# Patient Record
Sex: Female | Born: 1990 | Hispanic: No | Marital: Married | Smoking: Never smoker
Health system: Southern US, Community
[De-identification: ages and names within clinical notes are randomized; demographics above are authoritative.]

## PROBLEM LIST (undated history)

## (undated) DIAGNOSIS — N83209 Unspecified ovarian cyst, unspecified side: Secondary | ICD-10-CM

## (undated) DIAGNOSIS — G43909 Migraine, unspecified, not intractable, without status migrainosus: Secondary | ICD-10-CM

## (undated) DIAGNOSIS — F419 Anxiety disorder, unspecified: Secondary | ICD-10-CM

## (undated) HISTORY — PX: MYRINGOTOMY: SUR874

## (undated) HISTORY — PX: OTHER SURGICAL HISTORY: SHX169

## (undated) HISTORY — DX: Migraine, unspecified, not intractable, without status migrainosus: G43.909

---
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
## 2009-03-21 ENCOUNTER — Ambulatory Visit: Payer: Self-pay | Admitting: Nurse Practitioner

## 2011-05-14 NOTE — Assessment & Plan Note (Signed)
NAMEARGIE, LOBER NO.:  [id number]   MEDICAL RECORD NO.:  [id number]          PATIENT TYPE:  POB   LOCATION:  [facility] at [facility]         FACILITY:  [facility]   PHYSICIAN:  Argentina Donovan, MD        DATE OF BIRTH:  [birth year]   DATE OF SERVICE:  12/13/2008                                  CLINIC NOTE   The patient comes office today with a migraine headache.  She states  that today her headache is an 8 on the scale of 1-10, it is left sided.  She does have worsening of her headache with movement and she does have  photophobia.  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  She does feel that is the trigger for this headache.  The patient has not taken her Norflex.  Prior to this, she did not meet  the full criteria for migraine.  She has not taken any triptans in the  past.   ASSESSMENT:  Migraine headache.   PLAN:  The patient was given a Maxalt 10 mg in the office.  Her headache  decrease from an 8/10 to a 2/10.  She will be given a prescription for  that to take 1 p.o. p.r.n. migraine, okay to repeat once in 2 hours, #12  with the 3 refills.  She is encouraged to take her medicines as  previously prescribed.  We will see her back in 3 months.      [name], NP    ______________________________  Argentina Donovan, MD    LR/MEDQ  D:  12/13/2008  T:  12/14/2008  Job:  [id number]

## 2011-05-14 NOTE — Assessment & Plan Note (Signed)
Megan Newman, Megan Newman               ACCOUNT NO.:  [id number]   MEDICAL RECORD NO.:  [id number]          PATIENT TYPE:  POB   LOCATION:  [facility]         FACILITY:  [facility]   PHYSICIAN:  Allie Bossier, MD        DATE OF BIRTH:  [birth year]   DATE OF SERVICE:  07/14/2008                                  CLINIC NOTE   HISTORY:  The patient comes to the office today for a headache  consultation.  She has been seen and evaluated at length at [facility].  These records have been reviewed.  They have basically come  up with a diagnosis of a chronic persistent daily headache with some  migraine variant.  She has been on several preventatives including  Topamax and amitriptyline.  She is currently on 25 mg of Topamax, and  her headaches are constant, approximately 7 on a scale of 1-10.  The  increase in dosage to greater than 25 has caused her to have some weight  loss.  She is currently 5 feet 7 inches and weighs 147 pounds.  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  She has done some physical therapy in the past and it was  maybe some help; otherwise, she has not done any nonmedical management  of her headache.  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  The patient and mother both deny any anxiety or  depression.  The patient does admit to having some sleep issues  including difficulty falling asleep, difficulty maintaining sleep, and  some chronic daytime fatigue.  The patient also has a history of chronic  constipation which she is cared for.   MENSTRUAL HISTORY:  The patient's menstrual history is that of regular  menstrual cycles.  She is currently using birth control pills for a  normal menstrual cycle.  She does have significant cramps if she does  not use birth control pills.  She and her mother are not sure of which  birth  control pill she is using at this time.   OBSTETRIC HISTORY:  She is G0, P0.   PERSONAL HISTORY:  The patient has a history of asthma and migraine  headaches.   SOCIAL HISTORY:  The patient lives at home with her parents.  She does  work outside of the home at [employer].  She does drink 1-2 caffeinated  beverages per day.  She does not smoke or drink.   REVIEW OF SYSTEMS:  She admits to numbness, weakness, fatigue, weight  gain, frequent headaches, dizzy spells, nausea, vomiting, and hot  flashes.   PHYSICAL EXAMINATION:  GENERAL:  Well-developed, well-nourished 20-year-  old may be a mixed race female, in no acute distress.  VITAL SIGNS:  Blood pressure is 118/66, weight is 147, pulse is 57.  HEENT:  Head is normocephalic and atraumatic.  CARDIAC:  Regular rate and rhythm.  No murmurs, rubs or thrills.  LUNGS:  Clear bilaterally without rales, rhonchi, or wheezes.  NEUROLOGIC:  The patient is neurologically intact.  She is alert and  oriented.  Her speech is clear and fluent.  There is no flight of ideas.  She does not appear overly anxious or histrionic.  She is well  coordinated with good muscle movement and good muscle sensation.   ASSESSMENT:  Chronic persistent daily headaches.   PLAN:  We have decided to work very hard in the next 6 weeks to try to  get her pain at least down to a 3 or 4.  We will do this by trying to  get her sleep in some better control.  We did have a lengthy discussion  on sleep hygiene, and she will be sent for a sleep study.  She will also  be taken to a counselor in [place] area which is [place] by her  mother for counseling.  She will be working on ways to settle her brain  down as well as not be quite such a perfectionist and have some self-  calming techniques.  She will also exercise for 1 hour daily.  We will  also increase the Topamax to 50 mg at bedtime and add Zoloft 50 mg one-  half tablet for 1 week and then up to 1 tablet at bedtime.   The patient  does have Flexeril which she will use at home on a p.r.n. basis, and she  will return to the office in 3 weeks or p.r.n.  We will see her then.      [name], NP    ______________________________  Allie Bossier, MD    LR/MEDQ  D:  07/14/2008  T:  07/15/2008  Job:  [id number]

## 2011-05-14 NOTE — Assessment & Plan Note (Signed)
[name], Newman NO.:  [id number]   MEDICAL RECORD NO.:  [id number]          PATIENT TYPE:  POB   LOCATION:  [facility]         FACILITY:  [facility]   PHYSICIAN:  Ginger Carne, MD DATE OF BIRTH:  Mar 10, 1991   DATE OF SERVICE:  08/04/2008                                  CLINIC NOTE   The patient comes to the office today for a followup on her chronic  daily headaches.  The patient has done approximately 90% of what has  been asked of her.  She has done a very good job of working toward our  goal of decreasing her headache pain level.  She has had her sleep study  done, it has not been read to this point.  She has gone up on her  Topamax to 50 mg.  She has started her Zoloft and is currently at only  one-half tablet.  She did go to the beach for an entire week and during  that time realized that she had only had 1 headache.  She did start her  counseling yesterday.  She does like her counselor and does think that  this will be a worthwhile experience for her.  She has been working on  her sleep hygiene.  She has been trying to go to bed by 1 a.m.  She is  not listening to her music.  She is listening to an ocean CD.  She is  not drinking any caffeine.  She is overall improved.  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  She  realizes that will bring her some stressful events, but she is looking  forward to that.   PHYSICAL EXAMINATION:  GENERAL:  Well-developed, well-nourished 20-year-  old Caucasian female in no acute distress.  VITAL SIGNS:  Blood pressure is 108/71, pulse is 73, and weight is  145.5.   ASSESSMENT AND PLAN:  Chronic daily/migrainous headache.   PLAN:  The patient has again done seemingly a good job of following our  plan.  She does need to go up to 50 mg on her Zoloft, and she will also  go up to 75 mg on her Topamax.  She will continue her counseling and her  good efforts with her sleep  hygiene.  She will be given Norflex 100 mg  to take 1 p.o. b.i.d. p.r.n. headache, #30 with 2 refills.  She is again  encouraged to exercise up to 1 hour per day, that is the one thing that  she has not worked well on.  She will return to the clinic in 6 weeks or  sooner if needed.      Megan Richter, NP    ______________________________  Ginger Carne, MD    LR/MEDQ  D:  08/04/2008  T:  08/05/2008  Job:  [id number]

## 2011-05-14 NOTE — Assessment & Plan Note (Signed)
Megan Newman, Megan Newman               ACCOUNT NO.:  [id number]   MEDICAL RECORD NO.:  [id number]          PATIENT TYPE:  POB   LOCATION:  [facility]         FACILITY:  [facility]   PHYSICIAN:  Johnella Moloney, MD        DATE OF BIRTH:  1991/10/13   DATE OF SERVICE:  03/21/2009                                  CLINIC NOTE   The patient comes to office today for followup on a migraine headache.  The patient states that her headaches are no longer daily that she has  certainly been doing better with her headaches, although she is having  approximately 12 headaches per month.  She is currently in nursing  school.  She is taking 12 hours.  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  She is having some difficulty with the  noise level in the dormitory, but in general is improved.   PHYSICAL EXAMINATION:  VITAL SIGNS:  Blood pressure is 110/68, weight is  137, height is 5 feet 6, pulse is 61.  GENERAL:  Well-developed, well-nourished, 20 year old Caucasian female  in no acute distress.   ASSESSMENT:  Migraine headache.   PLAN:  We have decided that we will go up on her Topamax to 100 mg.  She  will take 1 p.o. at bedtime #30 with 6 refills.  She will get a refill  on her Zoloft 50 mg 1 p.o. daily #30 with 6 refills.  The patient will  also add Anaprox DS 1 p.o. b.i.d. p.r.n. headache #30 with 2 refills.  The patient is asked to use her Maxalt more frequently.  She as yet not  gone through an entire month of prescription, although it has been 3  months since her last office visit.  She will follow up in 6 months.      [name], NP    ______________________________  Johnella Moloney, MD    LR/MEDQ  D:  03/21/2009  T:  03/22/2009  Job:  [id number]

## 2011-05-14 NOTE — Assessment & Plan Note (Signed)
NAMEMAZZIE, Megan Newman NO.:  [id number]   MEDICAL RECORD NO.:  [id number]          PATIENT TYPE:  POB   LOCATION:  [facility] at [facility]         FACILITY:  [facility]   PHYSICIAN:  Tinnie Gens, MD        DATE OF BIRTH:  [birth year]   DATE OF SERVICE:  10/04/2008                                  CLINIC NOTE   The patient comes to the office today to follow up on her chronic  tension-type headaches.  The patient is currently at 75 mg of her  Topamax and 50 mg of her Zoloft and she would like to stay at that  level.  She is currently doing her exercises several times per week.  She does feel that her headaches are overall significantly improved.  Her headaches are somewhat worse around her menstrual cycle.  She does  not describe migraine-like symptoms.  She describes more of tension-type  headaches.  She was given a prescription for Norflex at her last office  visit and has not taken that.  She has left that at her mother's house.  She is advised to get that from her mother's house and start those when  she has a headache.  She did want me to know that she had one episode of  near fainting episode at the beach this summer.  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   VITAL SIGNS:  Blood pressure is 97/67, pulse is 54, weight is 141.   ASSESSMENT:  Chronic headaches, improving.   PLAN:  The patient will continue Topamax 75 mg and Zoloft 50 mg.  She  will take these daily.  She has opted not to go up on these at this  time.  She is advised to get her Norflex and keep that at school with  her.  She will return to the clinic in 3 months.  She can call if she  needs to come in sooner.      Megan Richter, NP    ______________________________  Tinnie Gens, MD    LR/MEDQ  D:  10/04/2008  T:  10/05/2008  Job:  [id number]

## 2011-05-17 NOTE — Procedures (Signed)
NAMEVALOR, Megan Newman               ACCOUNT NO.:  [id number]   MEDICAL RECORD NO.:  [id number]          PATIENT TYPE:  OUT   LOCATION:  S[facility]EP CENTER                 FACILITY:  [facility]   PHYSICIAN:  Clinton D. Maple Hudson, MD, FCCP, FACPDATE OF BIRTH:  1991/06/27   DATE OF STUDY:                            NOCTURNAL POLYSOMNOGRAM   REFERRING PHYSICIAN:  [name], NP   INDICATION FOR STUDY:  Hypersomnia with sleep apnea.  Restless sleep.   EPWORTH SLEEPINESS SCORE:  8/24.  BMI 23.  Weight 143 pounds, height 66  inches, neck 11 inches.   MEDICATIONS:  Home medications charted and reviewed.   SLEEP ARCHITECTURE:  Total sleep time 340.5 minutes with sleep  efficiency 90.4%.  Stage I was 3.5%.  Stage II 85.9%.  Stage III 3.7%.  REM 6.9% of total sleep time.  Sleep latency 25 minutes.  REM latency  113 minutes.  Awake after sleep onset 10.5 minutes.  Arousal index 9.2.  No bedtime medication taken.   RESPIRATORY DATA:  Apnea/hypopnea index (AHI) 0.2 per hour.  A single  respiratory event was scored as a central apnea.   OXYGEN DATA:  Minimal snoring with oxygen desaturation to a nadir of  97%.  Mean oxygen saturation through the study was 98.3% on room air.   CARDIAC DATA:  Normal sinus rhythm with occasional PAC.   MOVEMENT-PARASOMNIA:  No significant movement disturbance.  No bathroom  trips.   IMPRESSIONS-RECOMMENDATIONS:  1. No significant respiratory disturbance of sleep, AHI 0.2 per hour      (normal range 0-5 per hour).  Minimal snoring with oxygen      desaturation to a nadir of 97%.  2. No significant movement disturbance.  3. Unremarkable sleep architecture for age and sleep center      environment.      Clinton D. Maple Hudson, MD, [facility], FACP  Diplomate, Biomedical engineer of Sleep Medicine  Electronically Signed    CDY/MEDQ  D:  08/06/2008 11:12:27  T:  08/06/2008 11:46:57  Job:  [id number]

## 2012-05-22 ENCOUNTER — Ambulatory Visit: Payer: Self-pay

## 2013-11-10 DIAGNOSIS — Z8742 Personal history of other diseases of the female genital tract: Secondary | ICD-10-CM | POA: Insufficient documentation

## 2015-10-08 ENCOUNTER — Emergency Department (HOSPITAL_COMMUNITY): Payer: 59

## 2015-10-08 ENCOUNTER — Encounter (HOSPITAL_COMMUNITY): Payer: Self-pay | Admitting: Emergency Medicine

## 2015-10-08 ENCOUNTER — Emergency Department (HOSPITAL_COMMUNITY)
Admission: EM | Admit: 2015-10-08 | Discharge: 2015-10-08 | Disposition: A | Discharge status: Home / Self Care | Payer: 59 | Attending: Emergency Medicine | Admitting: Emergency Medicine

## 2015-10-08 DIAGNOSIS — R102 Pelvic and perineal pain: Secondary | ICD-10-CM

## 2015-10-08 DIAGNOSIS — R1030 Lower abdominal pain, unspecified: Secondary | ICD-10-CM | POA: Insufficient documentation

## 2015-10-08 DIAGNOSIS — N9412 Deep dyspareunia: Secondary | ICD-10-CM | POA: Insufficient documentation

## 2015-10-08 DIAGNOSIS — F419 Anxiety disorder, unspecified: Secondary | ICD-10-CM | POA: Diagnosis not present

## 2015-10-08 DIAGNOSIS — R61 Generalized hyperhidrosis: Secondary | ICD-10-CM | POA: Insufficient documentation

## 2015-10-08 DIAGNOSIS — Z79899 Other long term (current) drug therapy: Secondary | ICD-10-CM | POA: Insufficient documentation

## 2015-10-08 DIAGNOSIS — Z8742 Personal history of other diseases of the female genital tract: Secondary | ICD-10-CM | POA: Diagnosis not present

## 2015-10-08 DIAGNOSIS — Z3202 Encounter for pregnancy test, result negative: Secondary | ICD-10-CM | POA: Diagnosis not present

## 2015-10-08 HISTORY — DX: Anxiety disorder, unspecified: F41.9

## 2015-10-08 HISTORY — DX: Unspecified ovarian cyst, unspecified side: N83.209

## 2015-10-08 LAB — I-STAT BETA HCG BLOOD, ED (MC, WL, AP ONLY): I-stat hCG, quantitative: <5 m[IU]/mL (ref <5)

## 2015-10-08 LAB — CBC WITH DIFFERENTIAL/PLATELET
BASOS PCT: 0 %
Basophils Absolute: 0 10*3/uL (ref 0.0–0.1)
EOS ABS: 0.4 10*3/uL (ref 0.0–0.7)
Eosinophils Relative: 6 %
HCT: 39.8 % (ref 36.0–46.0)
HEMOGLOBIN: 13.1 g/dL (ref 12.0–15.0)
Lymphocytes Relative: 27 %
Lymphs Abs: 1.8 10*3/uL (ref 0.7–4.0)
MCH: 29.2 pg (ref 26.0–34.0)
MCHC: 32.9 g/dL (ref 30.0–36.0)
MCV: 88.8 fL (ref 78.0–100.0)
MONOS PCT: 5 %
Monocytes Absolute: 0.3 10*3/uL (ref 0.1–1.0)
NEUTROS PCT: 62 %
Neutro Abs: 4.3 10*3/uL (ref 1.7–7.7)
PLATELETS: 268 10*3/uL (ref 150–400)
RBC: 4.48 MIL/uL (ref 3.87–5.11)
RDW: 13.1 % (ref 11.5–15.5)
WBC: 6.9 10*3/uL (ref 4.0–10.5)

## 2015-10-08 LAB — URINALYSIS, ROUTINE W REFLEX MICROSCOPIC
BILIRUBIN URINE: NEGATIVE
Glucose, UA: NEGATIVE mg/dL
HGB URINE DIPSTICK: NEGATIVE
KETONES UR: NEGATIVE mg/dL
Leukocytes, UA: NEGATIVE
Nitrite: NEGATIVE
PROTEIN: NEGATIVE mg/dL
Specific Gravity, Urine: 1.022 (ref 1.005–1.030)
UROBILINOGEN UA: 0.2 mg/dL (ref 0.0–1.0)
pH: 5.5 (ref 5.0–8.0)

## 2015-10-08 LAB — BASIC METABOLIC PANEL
Anion gap: 8 (ref 5–15)
BUN: 8 mg/dL (ref 6–20)
CALCIUM: 9 mg/dL (ref 8.9–10.3)
CO2: 21 mmol/L — AB (ref 22–32)
CREATININE: 0.69 mg/dL (ref 0.44–1.00)
Chloride: 107 mmol/L (ref 101–111)
GFR calc non Af Amer: >60 mL/min (ref >60)
Glucose, Bld: 100 mg/dL — ABNORMAL HIGH (ref 65–99)
Potassium: 3.9 mmol/L (ref 3.5–5.1)
SODIUM: 136 mmol/L (ref 135–145)

## 2015-10-08 LAB — WET PREP, GENITAL
Clue Cells Wet Prep HPF POC: NONE SEEN
Trich, Wet Prep: NONE SEEN
YEAST WET PREP: NONE SEEN

## 2015-10-08 MED ORDER — SODIUM CHLORIDE 0.9 % IV BOLUS (SEPSIS): 1000.0000 mL | Freq: Once | INTRAVENOUS | Status: DC

## 2015-10-08 MED ORDER — ONDANSETRON HCL 4 MG/2ML IJ SOLN
4.0000 mg | Freq: Once | INTRAMUSCULAR | Status: DC
  Filled 2015-10-08: qty 2

## 2015-10-08 MED ORDER — ONDANSETRON 4 MG PO TBDP: 4.0000 mg | ORAL_TABLET | ORAL | Status: DC | PRN

## 2015-10-08 NOTE — ED Notes (Signed)
Patient transported to Ultrasound 

## 2015-10-08 NOTE — Discharge Instructions (Signed)
Pelvic Pain, Female °Female pelvic pain can be caused by many different things and start from a variety of places. Pelvic pain refers to pain that is located in the lower half of the abdomen and between your hips. The pain may occur over a short period of time (acute) or may be reoccurring (chronic). The cause of pelvic pain may be related to disorders affecting the female reproductive organs (gynecologic), but it may also be related to the bladder, kidney stones, an intestinal complication, or muscle or skeletal problems. Getting help right away for pelvic pain is important, especially if there has been severe, sharp, or a sudden onset of unusual pain. It is also important to get help right away because some types of pelvic pain can be life threatening.  °CAUSES  °Below are only some of the causes of pelvic pain. The causes of pelvic pain can be in one of several categories.  °· Gynecologic. °¨ Pelvic inflammatory disease. °¨ Sexually transmitted infection. °¨ Ovarian cyst or a twisted ovarian ligament (ovarian torsion). °¨ Uterine lining that grows outside the uterus (endometriosis). °¨ Fibroids, cysts, or tumors. °¨ Ovulation. °· Pregnancy. °¨ Pregnancy that occurs outside the uterus (ectopic pregnancy). °¨ Miscarriage. °¨ Labor. °¨ Abruption of the placenta or ruptured uterus. °· Infection. °¨ Uterine infection (endometritis). °¨ Bladder infection. °¨ Diverticulitis. °¨ Miscarriage related to a uterine infection (septic abortion). °· Bladder. °¨ Inflammation of the bladder (cystitis). °¨ Kidney stone(s). °· Gastrointestinal. °¨ Constipation. °¨ Diverticulitis. °· Neurologic. °¨ Trauma. °¨ Feeling pelvic pain because of mental or emotional causes (psychosomatic). °· Cancers of the bowel or pelvis. °EVALUATION  °Your caregiver will want to take a careful history of your concerns. This includes recent changes in your health, a careful gynecologic history of your periods (menses), and a sexual history. Obtaining  your family history and medical history is also important. Your caregiver may suggest a pelvic exam. A pelvic exam will help identify the location and severity of the pain. It also helps in the evaluation of which organ system may be involved. In order to identify the cause of the pelvic pain and be properly treated, your caregiver may order tests. These tests may include:  °· A pregnancy test. °· Pelvic ultrasonography. °· An X-ray exam of the abdomen. °· A urinalysis or evaluation of vaginal discharge. °· Blood tests. °HOME CARE INSTRUCTIONS  °· Only take over-the-counter or prescription medicines for pain, discomfort, or fever as directed by your caregiver.   °· Rest as directed by your caregiver.   °· Eat a balanced diet.   °· Drink enough fluids to make your urine clear or pale yellow, or as directed.   °· Avoid sexual intercourse if it causes pain.   °· Apply warm or cold compresses to the lower abdomen depending on which one helps the pain.   °· Avoid stressful situations.   °· Keep a journal of your pelvic pain. Write down when it started, where the pain is located, and if there are things that seem to be associated with the pain, such as food or your menstrual cycle. °· Follow up with your caregiver as directed.   °SEEK MEDICAL CARE IF: °· Your medicine does not help your pain. °· You have abnormal vaginal discharge. °SEEK IMMEDIATE MEDICAL CARE IF:  °· You have heavy bleeding from the vagina.   °· Your pelvic pain increases.   °· You feel light-headed or faint.   °· You have chills.   °· You have pain with urination or blood in your urine.   °· You have uncontrolled   diarrhea or vomiting.   You have a fever or persistent symptoms for more than 3 days. Abdominal Pain, Adult Many things can cause abdominal pain. Usually, abdominal pain is not caused by a disease and will improve without treatment. It can often be observed and treated at home. Your health care provider will do a physical exam and possibly  order blood tests and X-rays to help determine the seriousness of your pain. However, in many cases, more time must pass before a clear cause of the pain can be found. Before that point, your health care provider may not know if you need more testing or further treatment. HOME CARE INSTRUCTIONS Monitor your abdominal pain for any changes. The following actions may help to alleviate any discomfort you are experiencing: Only take over-the-counter or prescription medicines as directed by your health care provider. Do not take laxatives unless directed to do so by your health care provider. Try a clear liquid diet (broth, tea, or water) as directed by your health care provider. Slowly move to a bland diet as tolerated. SEEK MEDICAL CARE IF: You have unexplained abdominal pain. You have abdominal pain associated with nausea or diarrhea. You have pain when you urinate or have a bowel movement. You experience abdominal pain that wakes you in the night. You have abdominal pain that is worsened or improved by eating food. You have abdominal pain that is worsened with eating fatty foods. You have a fever. SEEK IMMEDIATE MEDICAL CARE IF: Your pain does not go away within 2 hours. You keep throwing up (vomiting). Your pain is felt only in portions of the abdomen, such as the right side or the left lower portion of the abdomen. You pass bloody or black tarry stools. MAKE SURE YOU: Understand these instructions. Will watch your condition. Will get help right away if you are not doing well or get worse.   This information is not intended to replace advice given to you by your health care provider. Make sure you discuss any questions you have with your health care provider.   Document Released: 09/25/2005 Document Revised: 09/06/2015 Document Reviewed: 08/25/2013 Elsevier Interactive Patient Education 2016 [employer].   You have a fever and your symptoms suddenly get worse.   You are being  physically or sexually abused.   This information is not intended to replace advice given to you by your health care provider. Make sure you discuss any questions you have with your health care provider.   Document Released: 11/12/2004 Document Revised: 09/06/2015 Document Reviewed: 04/06/2012 Elsevier Interactive Patient Education [employer]

## 2015-10-08 NOTE — ED Provider Notes (Signed)
Complains of infraumbilical pain, nonradiating onset upon awakening 7 AM today. Pain feels like ruptured ovarian cyst that she experienced in the past She is presently pain-free without treatment. Presently hungry. No other associated symptoms. On exam alert no distress. On exam alert no distress. Abdomen nondistended normal active bowel sounds nontender  Doug Sou, MD 10/08/15 1054

## 2015-10-08 NOTE — ED Provider Notes (Signed)
CSN: [id number]     Arrival date & time 10/08/15  0841 History   First MD Initiated Contact with Patient 10/08/15 [phone]     Chief Complaint  Patient presents with  . Abdominal Pain     (Consider location/radiation/quality/duration/timing/severity/associated sxs/prior Treatment) HPI  Patient is a 24 year old female with history of ovarian cyst, anxiety, HPV, and endometriosis, she presents to the emergency Department with generalized lower abdominal pain that began suddenly at 7 AM this morning, described as sharp, rated 8/10 in severity, without radiation. It lasted for approximately 20 minutes with a gradual increase in severity to 10/10 pain causing her to" on all fours "on the bathroom floor, with nausea, hot flashes and sweats.  She was unable to ease the pain with any positional changes, she did not take any medicine to treat him however it spontaneously began to resolve after approximately 20 minutes, but the location of her pain did localize to her right lower quadrant.  She presented to the emergency room for evaluation of abdominal pain, and upon arrival had some remaining pressure, but denies any pain.  In 2014 patient states she had an ovarian cyst rupture with similar symptoms however she states at that time they were more severe causing her to have syncopal episode and vomiting.  The patient denies any hematuria, dysuria, urinary frequency, urinary urgency, vaginal discharge, vaginal irritation, vomiting, diarrhea, constipation, fever, chills, sweats, loss of appetite, flank pain.  She does not believe that she has had any exposure to STDs, she has been with her boyfriend for 4 years and is monogamous, without any barrier methods of birth control, however she is on continuous oral birth control. 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 She has occasional spotting and estimates her last vaginal bleeding/spotting was early September.  She does not believe she could be pregnant  Past Medical History  Diagnosis Date  . Ovarian cyst   . Anxiety    Past Surgical History  Procedure Laterality Date  . Arthroscopic knee surgery (right knee)      right knee   History reviewed. No pertinent family history. Social History  Substance Use Topics  . Smoking status: Never Smoker   . Smokeless tobacco: None  . Alcohol Use: Yes     Comment: occassional   OB History    No data available     Review of Systems  Constitutional: Positive for diaphoresis. Negative for fever, chills, activity change, appetite change, fatigue and unexpected weight change.  HENT: Negative.   Eyes: Negative.   Respiratory: Negative.  Negative for chest tightness and shortness of breath.   Cardiovascular: Negative.  Negative for chest pain, palpitations and leg swelling.  Gastrointestinal: Negative for vomiting, diarrhea, constipation, blood in stool, abdominal distention and anal bleeding.  Endocrine: Negative.   Genitourinary: Positive for dyspareunia. Negative for dysuria, urgency, frequency, hematuria, flank pain, decreased urine volume, vaginal bleeding, vaginal discharge, difficulty urinating, genital sores and vaginal pain.  Musculoskeletal: Negative.   Skin: Negative.   Neurological: Negative.   Psychiatric/Behavioral: Negative.       Allergies  Cephalosporins  Home Medications   Prior to Admission medications   Medication Sig Start Date End Date Taking? Authorizing Provider  acetaminophen (TYLENOL) 325 MG tablet Take 650 mg by mouth every 6 (six) hours as  needed (pain).   Yes Historical Provider, MD  desogestrel-ethinyl estradiol (APRI,EMOQUETTE,SOLIA) 0.15-30 MG-MCG tablet Take 1 tablet by mouth daily. 06/08/15  Yes Historical Provider, MD  rizatriptan (MAXALT) 10  MG tablet Take 10 mg by mouth as needed for migraine. May repeat in 2 hours if needed   Yes Historical Provider, MD  sertraline (ZOLOFT) 50 MG tablet Take 50 mg by mouth daily.   Yes Historical Provider, MD   BP 109/66 mmHg  Pulse 66  Temp(Src) 98.1 F (36.7 C) (Oral)  Resp 18  Ht 5\' 6"  (1.676 m)  Wt 179 lb (81.194 kg)  BMI 28.91 kg/m2  SpO2 99% Physical Exam  Constitutional: She is oriented to person, place, and time. She appears well-developed and well-nourished. No distress.  HENT:  Head: Normocephalic and atraumatic.  Nose: Nose normal.  Mouth/Throat: Oropharynx is clear and moist. No oropharyngeal exudate.  Eyes: Conjunctivae and EOM are normal. Pupils are equal, round, and reactive to light. Right eye exhibits no discharge. Left eye exhibits no discharge. No scleral icterus.  Neck: Normal range of motion. No JVD present. No tracheal deviation present. No thyromegaly present.  Cardiovascular: Normal rate, regular rhythm, normal heart sounds and intact distal pulses.  Exam reveals no gallop and no friction rub.   No murmur heard. Pulmonary/Chest: Effort normal and breath sounds normal. No respiratory distress. She has no wheezes. She has no rales. She exhibits no tenderness.  Abdominal: Soft. Normal appearance and bowel sounds are normal. She exhibits no distension and no mass. There is tenderness in the right lower quadrant. There is rebound and tenderness at McBurney's point. There is no guarding and no CVA tenderness.  No suprapubic tenderness, negative Rovsing's, point tenderness to palpation at McBurney's point, negative psoas and obturator sign, negative heel tap test, no CVA tenderness  Genitourinary: Uterus normal. Cervix exhibits motion tenderness. Right adnexum displays no mass, no tenderness and no fullness. Left adnexum displays no mass, no tenderness and no fullness.  Musculoskeletal: Normal range of motion. She exhibits no edema or tenderness.  Lymphadenopathy:    She  has no cervical adenopathy.  Neurological: She is alert and oriented to person, place, and time. She has normal reflexes. No cranial nerve deficit. She exhibits normal muscle tone. Coordination normal.  Skin: Skin is warm and dry. No rash noted. She is not diaphoretic. No erythema. No pallor.  Psychiatric: She has a normal mood and affect. Her behavior is normal. Judgment and thought content normal.  Nursing note and vitals reviewed.   ED Course  Procedures (including critical care time) Labs Review Labs Reviewed  WET PREP, GENITAL - Abnormal; Notable for the following:    WBC, Wet Prep HPF POC FEW (*)    All other components within normal limits  BASIC METABOLIC PANEL - Abnormal; Notable for the following:    CO2 21 (*)    Glucose, Bld 100 (*)    All other components within normal limits  CBC WITH DIFFERENTIAL/PLATELET  URINALYSIS, ROUTINE W REFLEX MICROSCOPIC (NOT AT [facility])  RPR  HIV ANTIBODY (ROUTINE TESTING)  I-STAT BETA HCG BLOOD, ED ([facility], [facility], [facility] ONLY)  GC/CHLAMYDIA PROBE AMP ([facility]) NOT AT [facility]   Pelvic exam: VULVA: normal appearing vulva with no masses, tenderness or lesions, VAGINA: normal appearing vagina with normal color and discharge, no lesions, CERVIX: normal appearing cervix without lesions, erythema, not friable, nulliparous os, moderate mucoid discharge clear to white, UTERUS: uterus is normal size, shape, consistency and nontender, ADNEXA: normal adnexa in size,  nontender and no masses, WET MOUNT done - results: negative for pathogens, normal epithelial cells, + white blood cells, exam chaperoned by [facility] RN.   Imaging Review US Transvaginal Non-ob  10/08/2015   CLINICAL DATA:  Acute right lower quadrant pelvic pain.  EXAM: TRANSABDOMINAL AND TRANSVAGINAL ULTRASOUND OF PELVIS  DOPPLER ULTRASOUND OF OVARIES  TECHNIQUE: Both transabdominal and transvaginal ultrasound examinations of the pelvis were performed. Transabdominal technique was performed for global  imaging of the pelvis including uterus, ovaries, adnexal regions, and pelvic cul-de-sac.  It was necessary to proceed with endovaginal exam following the transabdominal exam to visualize the endometrium and ovaries. Color and duplex Doppler ultrasound was utilized to evaluate blood flow to the ovaries.  COMPARISON:  Ultrasound of May 22, 2012.  FINDINGS: Uterus  Measurements: 6.0 x 4.1 x 3.1 cm. No fibroids or other mass visualized.  Endometrium  Thickness: 3.5 mm which is within normal limits. No focal abnormality visualized.  Right ovary  Measurements: 2.2 x 2.1 x 1.1 cm. Normal appearance/no adnexal mass.  Left ovary  Measurements: 1.9 x 1.5 x 1.4 cm. Normal appearance/no adnexal mass.  Pulsed Doppler evaluation of both ovaries demonstrates normal low-resistance arterial and venous waveforms.  Other findings  Trace free fluid is noted which most likely is physiologic.  IMPRESSION: No definite abnormality seen in the pelvis.   Electronically Signed   By: Lupita Raider, M.D.   On: 10/08/2015 11:41   US Pelvis Complete  10/08/2015   CLINICAL DATA:  Acute right lower quadrant pelvic pain.  EXAM: TRANSABDOMINAL AND TRANSVAGINAL ULTRASOUND OF PELVIS  DOPPLER ULTRASOUND OF OVARIES  TECHNIQUE: Both transabdominal and transvaginal ultrasound examinations of the pelvis were performed. Transabdominal technique was performed for global imaging of the pelvis including uterus, ovaries, adnexal regions, and pelvic cul-de-sac.  It was necessary to proceed with endovaginal exam following the transabdominal exam to visualize the endometrium and ovaries. Color and duplex Doppler ultrasound was utilized to evaluate blood flow to the ovaries.  COMPARISON:  Ultrasound of May 22, 2012.  FINDINGS: Uterus  Measurements: 6.0 x 4.1 x 3.1 cm. No fibroids or other mass visualized.  Endometrium  Thickness: 3.5 mm which is within normal limits. No focal abnormality visualized.  Right ovary  Measurements: 2.2 x 2.1 x 1.1 cm. Normal  appearance/no adnexal mass.  Left ovary  Measurements: 1.9 x 1.5 x 1.4 cm. Normal appearance/no adnexal mass.  Pulsed Doppler evaluation of both ovaries demonstrates normal low-resistance arterial and venous waveforms.  Other findings  Trace free fluid is noted which most likely is physiologic.  IMPRESSION: No definite abnormality seen in the pelvis.   Electronically Signed   By: Lupita Raider, M.D.   On: 10/08/2015 11:41   [place] Art/ven Flow Abd Pelv Doppler  10/08/2015   CLINICAL DATA:  Acute right lower quadrant pelvic pain.  EXAM: TRANSABDOMINAL AND TRANSVAGINAL ULTRASOUND OF PELVIS  DOPPLER ULTRASOUND OF OVARIES  TECHNIQUE: Both transabdominal and transvaginal ultrasound examinations of the pelvis were performed. Transabdominal technique was performed for global imaging of the pelvis including uterus, ovaries, adnexal regions, and pelvic cul-de-sac.  It was necessary to proceed with endovaginal exam following the transabdominal exam to visualize the endometrium and ovaries. Color and duplex Doppler ultrasound was utilized to evaluate blood flow to the ovaries.  COMPARISON:  Ultrasound of May 22, 2012.  FINDINGS: Uterus  Measurements: 6.0 x 4.1 x 3.1 cm. No fibroids or other mass visualized.  Endometrium  Thickness: 3.5 mm which is within normal  limits. No focal abnormality visualized.  Right ovary  Measurements: 2.2 x 2.1 x 1.1 cm. Normal appearance/no adnexal mass.  Left ovary  Measurements: 1.9 x 1.5 x 1.4 cm. Normal appearance/no adnexal mass.  Pulsed Doppler evaluation of both ovaries demonstrates normal low-resistance arterial and venous waveforms.  Other findings  Trace free fluid is noted which most likely is physiologic.  IMPRESSION: No definite abnormality seen in the pelvis.   Electronically Signed   By: Lupita Raider, M.D.   On: 10/08/2015 11:41   I have personally reviewed and evaluated these images and lab results as part of my medical decision-making.   EKG Interpretation None       MDM   Final diagnoses:  Lower abdominal pain    Pt with lower abdominal pain/pelvic pain, with sudden onset this am, minimal RLQ suprapubic  "pressure" remaining upon presentation to the ER.    Ddx:  Ovarian cyst may be etiology, however more urgent diagnoses to rule out include Ovarian torsion, acute appendicitis, TOA, PID  Her initial vitals are within normal limits, she is afebrile, without tachycardia or hypertension, which makes acute abdomen or advanced infection less likely.  Bimanual exam at bedside reveals mild CMT.  The plan, pending negative pregnancy test, includes pelvic ultrasound with flow, pelvic exam with STD testing.  Patient states she is unable to give a urine sample this time.  Basic labs, STD panel, i-stat beta hCG ordered at this time. Staff was unable to place an IV due to difficult IV start, currently unable to get IV fluids. Patient will remain NPO at this time. She denied current pain or nausea, was not given any pain medicine or Zofran.   [place] negative, trace free fluid Patient's lab work is unremarkable, pelvic exam and STD testing obtained, wet prep shows white blood cells but no presence of trichomoniasis, clue cells, or yeast.  She had no CMT when having transvaginal ultrasound.    Pt has not had any more abdominal pain, I do not believe that CT imaging is currently indicated without symptoms.  This was discussed with the patient and her family.  They agree that they are comfortable going home.  They only live 5 minutes from the hospital.  They agree to return if pt has increasing/severe abd pain, N/V fever.    Pt was not treated for STD's, no concern for PID.  Pt will be contacted for tx if there are any positive results.    Danelle Berry, PA-C 10/09/15 1052  Doug Sou, MD 10/09/15 1710

## 2015-10-08 NOTE — ED Notes (Signed)
Pt woke up at 7am and had abd pain. Pt states she got hot and started sweating, felt like she was going to vomit, pt could barely stand. Pt states she experienced this several years ago and it was a ruptured ovarian cyst. Pt states now it just feels tight and slight twinges of pain.

## 2015-10-09 LAB — HIV ANTIBODY (ROUTINE TESTING W REFLEX): HIV Screen 4th Generation wRfx: NONREACTIVE

## 2015-10-09 LAB — GC/CHLAMYDIA PROBE AMP (~~LOC~~) NOT AT ARMC
Chlamydia: NEGATIVE
Neisseria Gonorrhea: NEGATIVE

## 2015-10-09 LAB — RPR: RPR: NONREACTIVE

## 2017-06-19 ENCOUNTER — Other Ambulatory Visit: Payer: Self-pay | Admitting: Physician Assistant

## 2017-06-19 DIAGNOSIS — R110 Nausea: Secondary | ICD-10-CM

## 2017-06-19 DIAGNOSIS — M5489 Other dorsalgia: Secondary | ICD-10-CM

## 2017-06-20 ENCOUNTER — Ambulatory Visit
Admission: RE | Admit: 2017-06-20 | Discharge: 2017-06-20 | Disposition: A | Discharge status: Home / Self Care | Payer: BLUE CROSS/BLUE SHIELD | Source: Ambulatory Visit | Attending: Physician Assistant | Admitting: Physician Assistant

## 2017-06-20 DIAGNOSIS — R110 Nausea: Secondary | ICD-10-CM

## 2017-06-20 DIAGNOSIS — M5489 Other dorsalgia: Secondary | ICD-10-CM

## 2017-06-20 MED ORDER — IOPAMIDOL (ISOVUE-300) INJECTION 61%
100.0000 mL | Freq: Once | INTRAVENOUS | Status: AC | PRN
  Administered 2017-06-20: 100 mL via INTRAVENOUS

## 2017-11-11 ENCOUNTER — Other Ambulatory Visit: Payer: Self-pay | Admitting: Physician Assistant

## 2017-11-11 DIAGNOSIS — R1013 Epigastric pain: Secondary | ICD-10-CM

## 2017-11-13 ENCOUNTER — Ambulatory Visit
Admission: RE | Admit: 2017-11-13 | Discharge: 2017-11-13 | Disposition: A | Discharge status: Home / Self Care | Payer: BLUE CROSS/BLUE SHIELD | Source: Ambulatory Visit | Attending: Physician Assistant | Admitting: Physician Assistant

## 2017-11-13 DIAGNOSIS — R1013 Epigastric pain: Secondary | ICD-10-CM

## 2017-11-17 ENCOUNTER — Other Ambulatory Visit: Payer: BLUE CROSS/BLUE SHIELD

## 2017-11-22 IMAGING — CT CT ABD-PELV W/ CM
3 of 4 series · 13 of 36 positions shown, 19 images · IV contrast (READICAT/WATER & [ID] ISOVUE 300)
Comparison: None.

CLINICAL DATA: Low back pain for 1 month.  Nausea.

EXAM:
CT ABDOMEN AND PELVIS WITH CONTRAST
TECHNIQUE: Multidetector CT imaging of the abdomen and pelvis was performed
using the standard protocol following bolus administration of
intravenous contrast.
CONTRAST:  100mL 4DUILS-QCC IOPAMIDOL (4DUILS-QCC) INJECTION 61%

[Series 3: abd/pelvis with · axial · 0.84mm/px · z∈[-376,-76]mm · 7 of 82 slices shown, 12 images]
[im 11/82  soft-tissue]
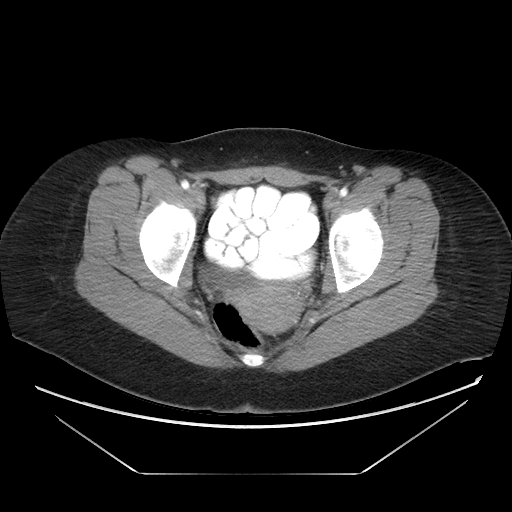
[im 11/82  bone]
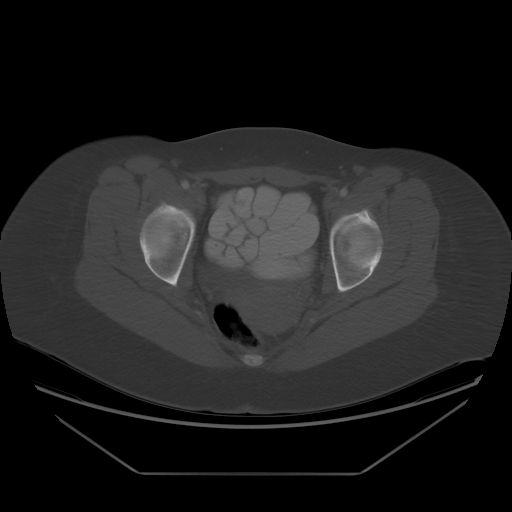
[im 21/82  soft-tissue]
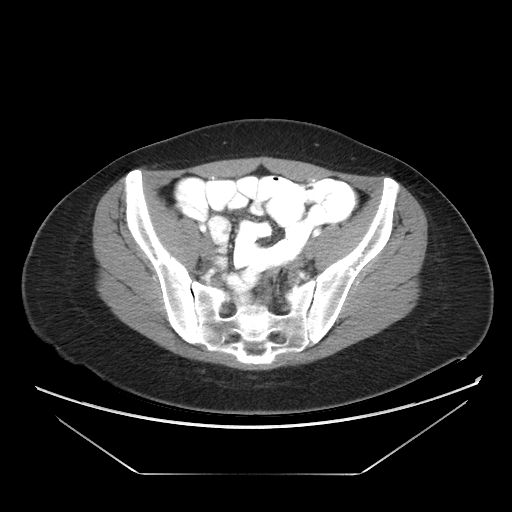
[im 31/82  soft-tissue]
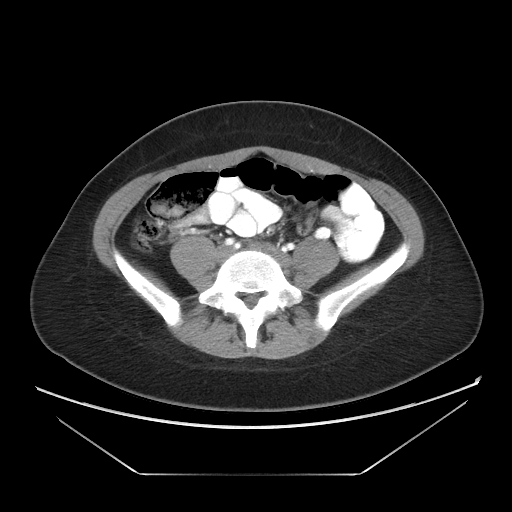
[im 41/82  soft-tissue]
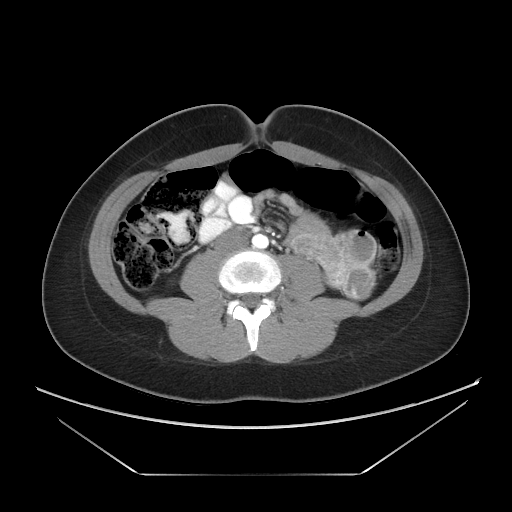
[im 41/82  lung]
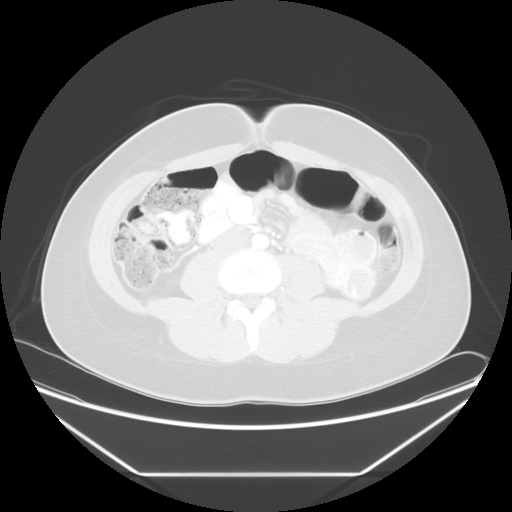
[im 51/82  soft-tissue]
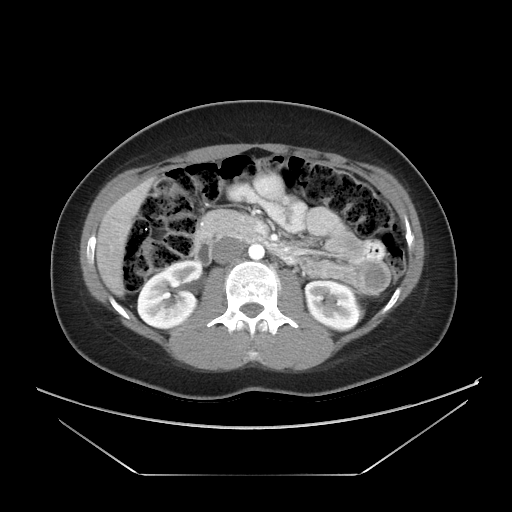
[im 51/82  lung]
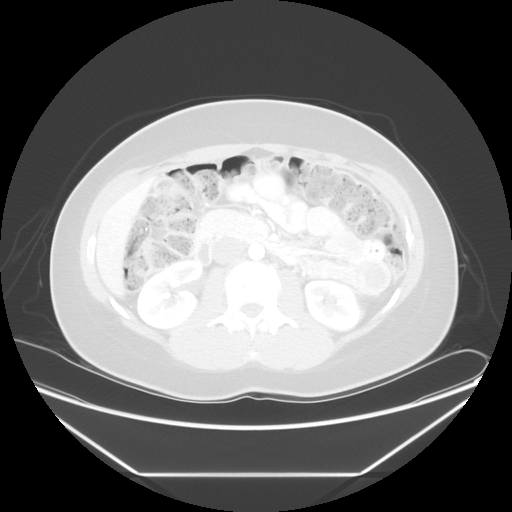
[im 61/82  soft-tissue]
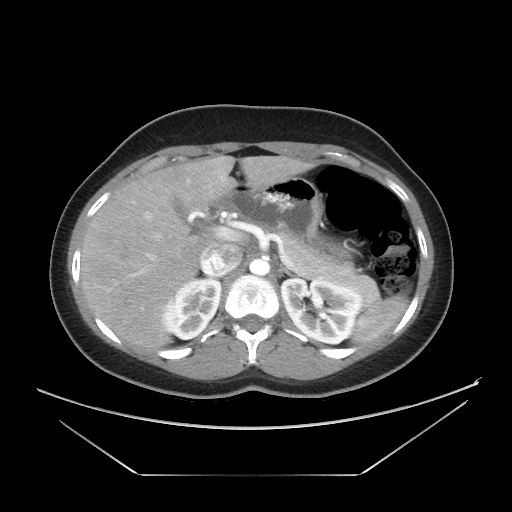
[im 61/82  lung]
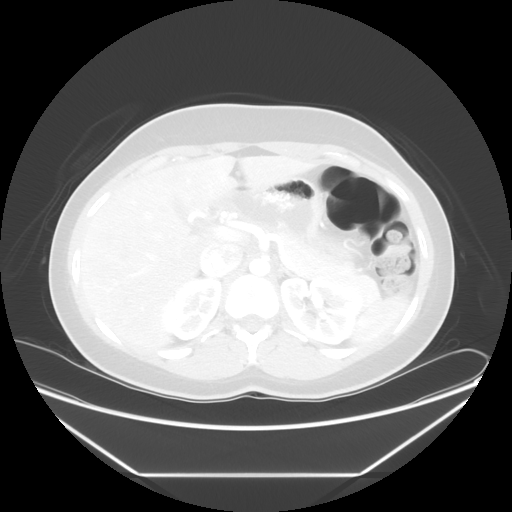
[im 71/82  soft-tissue]
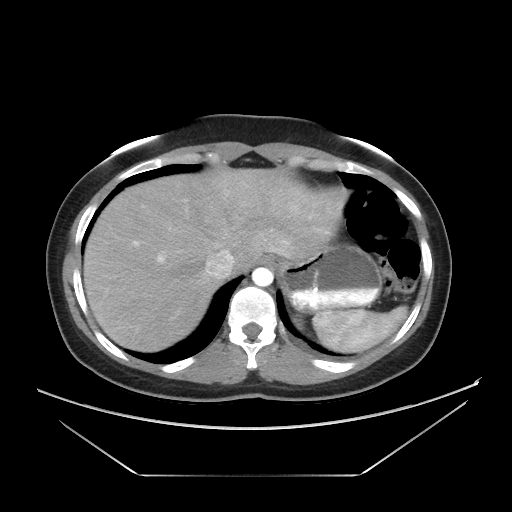
[im 71/82  lung]
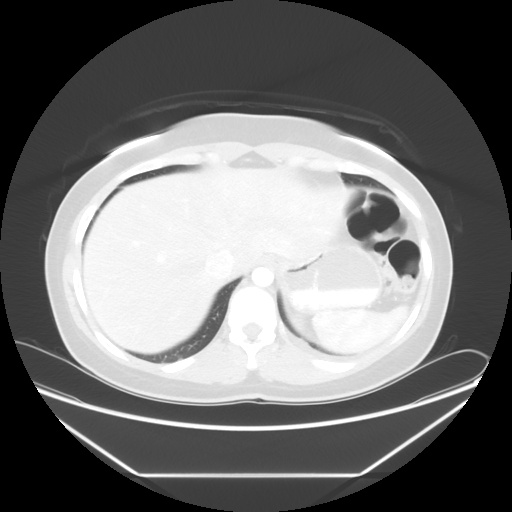

[Series 601: coronal body · coronal · 0.89mm/px · 1 of 119 slices shown, 2 images]
[im 40/119  soft-tissue]
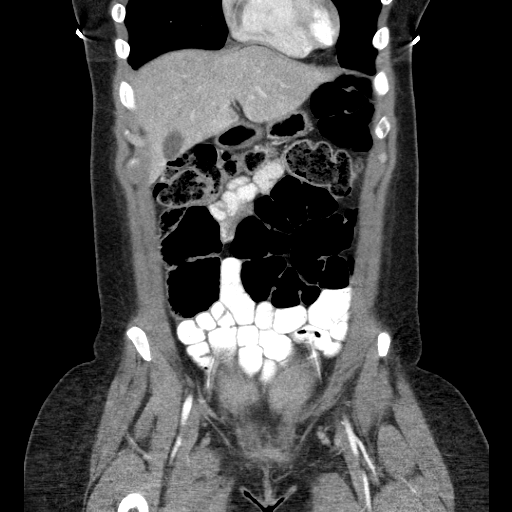
[im 40/119  bone]
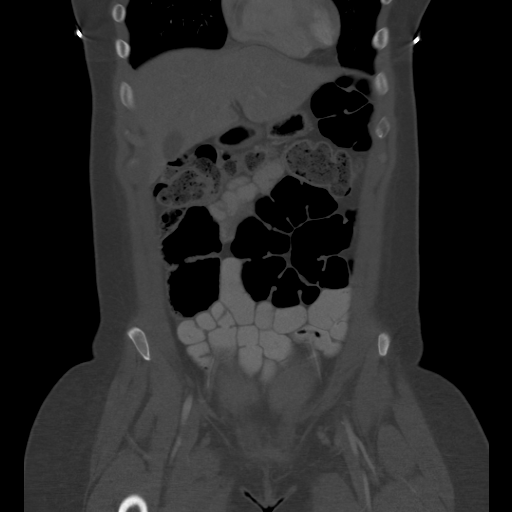

[Series 602: sagittal body · sagittal · 0.89mm/px · 5 of 174 slices shown]
[im 19/174  soft-tissue]
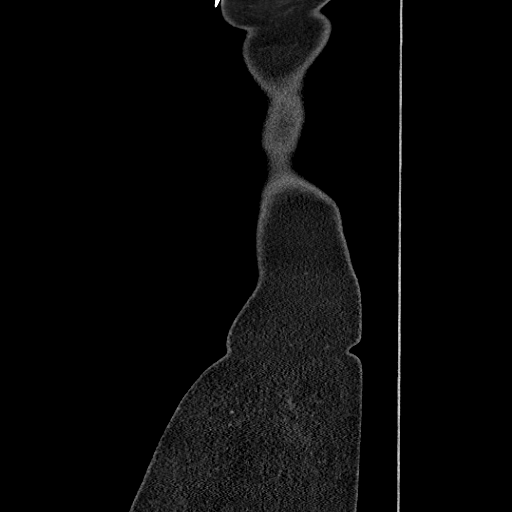
[im 37/174  soft-tissue]
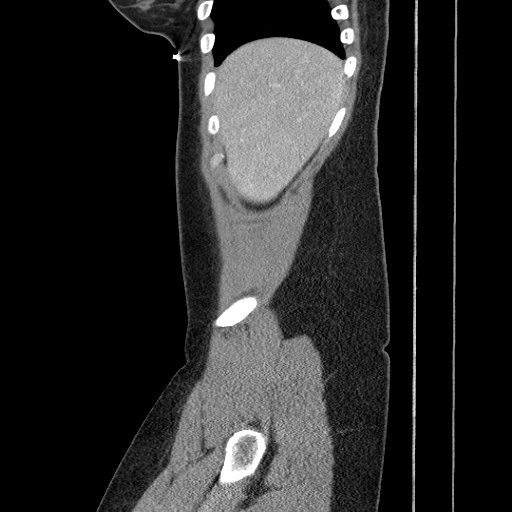
[im 55/174  soft-tissue]
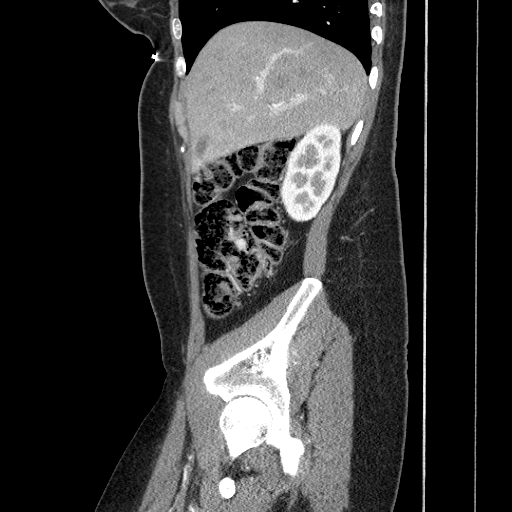
[im 73/174  soft-tissue]
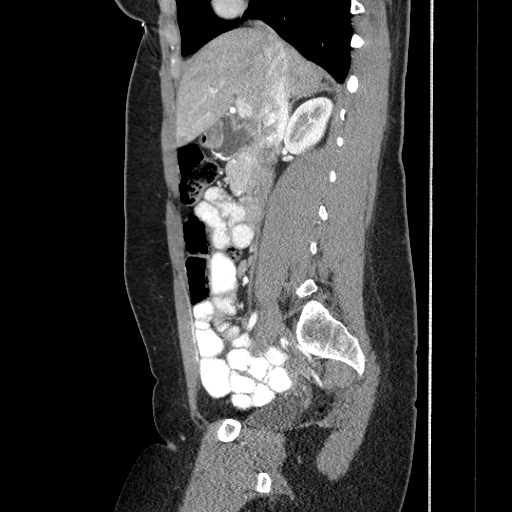
[im 101/174  soft-tissue]
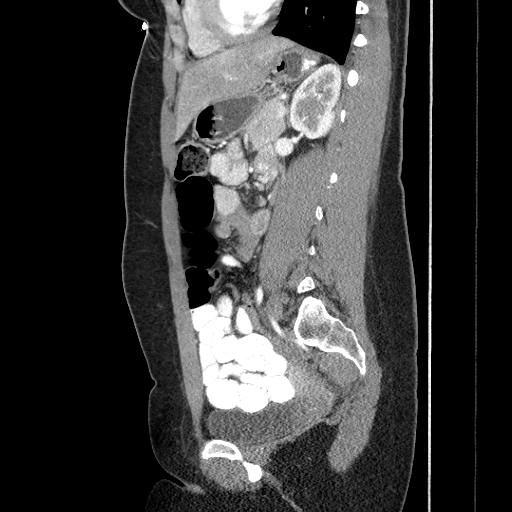

[13 of 36 positions shown; findings below may reference images not displayed]

FINDINGS: Lower chest: No acute abnormality.

Hepatobiliary: No focal liver abnormality is seen. No gallstones,
gallbladder wall thickening, or biliary dilatation.

Pancreas: Unremarkable. No pancreatic ductal dilatation or
surrounding inflammatory changes.

Spleen: Normal in size without focal abnormality.

Adrenals/Urinary Tract: Adrenal glands are unremarkable. Kidneys are
normal, without renal calculi, focal lesion, or hydronephrosis.
Bladder is unremarkable.

Stomach/Bowel: Stomach is within normal limits. Appendix appears
normal. No evidence of bowel wall thickening, distention, or
inflammatory changes.

Vascular/Lymphatic: No significant vascular findings are present. No
enlarged abdominal or pelvic lymph nodes.

Reproductive: Uterus and bilateral adnexa are unremarkable.

Other: No abdominal wall hernia or abnormality. No abdominopelvic
ascites.

Musculoskeletal: No acute or significant osseous findings.

## 2019-02-28 DIAGNOSIS — O009 Unspecified ectopic pregnancy without intrauterine pregnancy: Secondary | ICD-10-CM

## 2019-02-28 HISTORY — DX: Unspecified ectopic pregnancy without intrauterine pregnancy: O00.90

## 2019-03-21 ENCOUNTER — Other Ambulatory Visit: Payer: Self-pay

## 2019-03-21 ENCOUNTER — Other Ambulatory Visit: Payer: Self-pay | Admitting: Obstetrics & Gynecology

## 2019-03-21 ENCOUNTER — Encounter
Admission: EM | Disposition: A | Discharge status: Home / Self Care | Payer: Self-pay | Source: Home / Self Care | Attending: Emergency Medicine

## 2019-03-21 ENCOUNTER — Emergency Department: Payer: 59 | Admitting: Certified Registered"

## 2019-03-21 ENCOUNTER — Emergency Department: Payer: 59

## 2019-03-21 ENCOUNTER — Encounter: Payer: Self-pay | Admitting: Emergency Medicine

## 2019-03-21 ENCOUNTER — Emergency Department
Admission: EM | Admit: 2019-03-21 | Discharge: 2019-03-21 | Disposition: A | Discharge status: Home / Self Care | Payer: 59 | Attending: Obstetrics & Gynecology | Admitting: Obstetrics & Gynecology

## 2019-03-21 DIAGNOSIS — K661 Hemoperitoneum: Secondary | ICD-10-CM | POA: Diagnosis not present

## 2019-03-21 DIAGNOSIS — O4690 Antepartum hemorrhage, unspecified, unspecified trimester: Secondary | ICD-10-CM

## 2019-03-21 DIAGNOSIS — O209 Hemorrhage in early pregnancy, unspecified: Secondary | ICD-10-CM | POA: Diagnosis present

## 2019-03-21 DIAGNOSIS — F419 Anxiety disorder, unspecified: Secondary | ICD-10-CM | POA: Insufficient documentation

## 2019-03-21 DIAGNOSIS — Z3A01 Less than 8 weeks gestation of pregnancy: Secondary | ICD-10-CM | POA: Insufficient documentation

## 2019-03-21 DIAGNOSIS — O00101 Right tubal pregnancy without intrauterine pregnancy: Secondary | ICD-10-CM

## 2019-03-21 HISTORY — PX: LAPAROSCOPY: SHX197

## 2019-03-21 LAB — ABO/RH: ABO/RH(D): AB POS

## 2019-03-21 LAB — TYPE AND SCREEN
ABO/RH(D): AB POS
Antibody Screen: NEGATIVE

## 2019-03-21 LAB — POC URINE PREG, ED: PREG TEST UR: POSITIVE — AB

## 2019-03-21 LAB — HCG, QUANTITATIVE, PREGNANCY: hCG, Beta Chain, Quant, S: 5700 m[IU]/mL — ABNORMAL HIGH (ref <5)

## 2019-03-21 SURGERY — LAPAROSCOPY, DIAGNOSTIC
Anesthesia: General | Site: Abdomen

## 2019-03-21 MED ORDER — PHENYLEPHRINE HCL 10 MG/ML IJ SOLN
INTRAMUSCULAR | Status: DC | PRN
  Administered 2019-03-21 (×2): 100 ug via INTRAVENOUS

## 2019-03-21 MED ORDER — LACTATED RINGERS IV SOLN
INTRAVENOUS | Status: DC | PRN
  Administered 2019-03-21: 22:00:00 via INTRAVENOUS

## 2019-03-21 MED ORDER — LACTATED RINGERS IV SOLN: INTRAVENOUS | Status: DC

## 2019-03-21 MED ORDER — OXYCODONE-ACETAMINOPHEN 5-325 MG PO TABS
ORAL_TABLET | ORAL | Status: AC
  Administered 2019-03-21: 2 via ORAL
  Filled 2019-03-21: qty 2

## 2019-03-21 MED ORDER — LIDOCAINE HCL (CARDIAC) PF 100 MG/5ML IV SOSY
PREFILLED_SYRINGE | INTRAVENOUS | Status: DC | PRN
  Administered 2019-03-21: 100 mg via INTRAVENOUS

## 2019-03-21 MED ORDER — KETOROLAC TROMETHAMINE 30 MG/ML IJ SOLN
INTRAMUSCULAR | Status: DC | PRN
  Administered 2019-03-21: 30 mg via INTRAVENOUS

## 2019-03-21 MED ORDER — KETOROLAC TROMETHAMINE 30 MG/ML IJ SOLN
30.0000 mg | Freq: Four times a day (QID) | INTRAMUSCULAR | Status: DC
  Filled 2019-03-21: qty 1

## 2019-03-21 MED ORDER — ROCURONIUM BROMIDE 100 MG/10ML IV SOLN
INTRAVENOUS | Status: DC | PRN
  Administered 2019-03-21: 20 mg via INTRAVENOUS
  Administered 2019-03-21: 5 mg via INTRAVENOUS

## 2019-03-21 MED ORDER — OXYCODONE-ACETAMINOPHEN 5-325 MG PO TABS
1.0000 | ORAL_TABLET | ORAL | Status: DC | PRN
  Administered 2019-03-21: 2 via ORAL

## 2019-03-21 MED ORDER — SUCCINYLCHOLINE CHLORIDE 20 MG/ML IJ SOLN
INTRAMUSCULAR | Status: DC | PRN
  Administered 2019-03-21: 100 mg via INTRAVENOUS

## 2019-03-21 MED ORDER — FENTANYL CITRATE (PF) 100 MCG/2ML IJ SOLN
INTRAMUSCULAR | Status: DC | PRN
  Administered 2019-03-21: 100 ug via INTRAVENOUS

## 2019-03-21 MED ORDER — OXYCODONE-ACETAMINOPHEN 5-325 MG PO TABS: 1.0000 | ORAL_TABLET | ORAL | 0 refills | Status: DC | PRN

## 2019-03-21 MED ORDER — ONDANSETRON HCL 4 MG/2ML IJ SOLN: 4.0000 mg | Freq: Once | INTRAMUSCULAR | Status: DC | PRN

## 2019-03-21 MED ORDER — PROPOFOL 10 MG/ML IV BOLUS
INTRAVENOUS | Status: DC | PRN
  Administered 2019-03-21: 180 mg via INTRAVENOUS

## 2019-03-21 MED ORDER — MIDAZOLAM HCL 2 MG/2ML IJ SOLN
INTRAMUSCULAR | Status: AC
  Filled 2019-03-21: qty 2

## 2019-03-21 MED ORDER — FENTANYL CITRATE (PF) 100 MCG/2ML IJ SOLN
25.0000 ug | INTRAMUSCULAR | Status: DC | PRN
  Administered 2019-03-21 (×2): 25 ug via INTRAVENOUS
  Administered 2019-03-21: 50 ug via INTRAVENOUS

## 2019-03-21 MED ORDER — ONDANSETRON HCL 4 MG/2ML IJ SOLN
INTRAMUSCULAR | Status: DC | PRN
  Administered 2019-03-21: 4 mg via INTRAVENOUS

## 2019-03-21 MED ORDER — FENTANYL CITRATE (PF) 100 MCG/2ML IJ SOLN
INTRAMUSCULAR | Status: AC
  Filled 2019-03-21: qty 2

## 2019-03-21 MED ORDER — SUGAMMADEX SODIUM 200 MG/2ML IV SOLN
INTRAVENOUS | Status: DC | PRN
  Administered 2019-03-21: 200 mg via INTRAVENOUS

## 2019-03-21 MED ORDER — MIDAZOLAM HCL 2 MG/2ML IJ SOLN
INTRAMUSCULAR | Status: DC | PRN
  Administered 2019-03-21: 2 mg via INTRAVENOUS

## 2019-03-21 MED ORDER — PROPOFOL 10 MG/ML IV BOLUS
INTRAVENOUS | Status: AC
  Filled 2019-03-21: qty 20

## 2019-03-21 MED ORDER — DEXAMETHASONE SODIUM PHOSPHATE 10 MG/ML IJ SOLN
INTRAMUSCULAR | Status: DC | PRN
  Administered 2019-03-21: 5 mg via INTRAVENOUS

## 2019-03-21 MED ORDER — MORPHINE SULFATE (PF) 4 MG/ML IV SOLN: 1.0000 mg | INTRAVENOUS | Status: DC | PRN

## 2019-03-21 MED ORDER — BUPIVACAINE HCL (PF) 0.5 % IJ SOLN
INTRAMUSCULAR | Status: DC | PRN
  Administered 2019-03-21: 6 mL

## 2019-03-21 MED ORDER — ACETAMINOPHEN 650 MG RE SUPP
650.0000 mg | RECTAL | Status: DC | PRN
  Filled 2019-03-21: qty 1

## 2019-03-21 MED ORDER — ACETAMINOPHEN 325 MG PO TABS: 650.0000 mg | ORAL_TABLET | ORAL | Status: DC | PRN

## 2019-03-21 MED ORDER — BUPIVACAINE HCL (PF) 0.5 % IJ SOLN
INTRAMUSCULAR | Status: AC
  Filled 2019-03-21: qty 30

## 2019-03-21 SURGICAL SUPPLY — 41 items
BLADE SURG SZ11 CARB STEEL (BLADE) ×3 IMPLANT
CANISTER SUCT 1200ML W/VALVE (MISCELLANEOUS) ×3 IMPLANT
CATH ROBINSON RED A/P 16FR (CATHETERS) ×3 IMPLANT
CHLORAPREP W/TINT 26 (MISCELLANEOUS) ×3 IMPLANT
COVER WAND RF STERILE (DRAPES) ×3 IMPLANT
DERMABOND ADVANCED (GAUZE/BANDAGES/DRESSINGS) ×2
DERMABOND ADVANCED .7 DNX12 (GAUZE/BANDAGES/DRESSINGS) ×1 IMPLANT
DRSG TELFA 4X3 1S NADH ST (GAUZE/BANDAGES/DRESSINGS) IMPLANT
GLOVE BIO SURGEON STRL SZ8 (GLOVE) ×6 IMPLANT
GLOVE INDICATOR 8.0 STRL GRN (GLOVE) ×6 IMPLANT
GOWN STRL REUS W/ TWL LRG LVL3 (GOWN DISPOSABLE) ×1 IMPLANT
GOWN STRL REUS W/ TWL XL LVL3 (GOWN DISPOSABLE) ×1 IMPLANT
GOWN STRL REUS W/TWL LRG LVL3 (GOWN DISPOSABLE) ×2
GOWN STRL REUS W/TWL XL LVL3 (GOWN DISPOSABLE) ×2
GRASPER SUT TROCAR 14GX15 (MISCELLANEOUS) ×3 IMPLANT
IRRIGATION STRYKERFLOW (MISCELLANEOUS) ×1 IMPLANT
IRRIGATOR STRYKERFLOW (MISCELLANEOUS) ×3
IV LACTATED RINGERS 1000ML (IV SOLUTION) IMPLANT
KIT PINK PAD W/HEAD ARE REST (MISCELLANEOUS) ×3
KIT PINK PAD W/HEAD ARM REST (MISCELLANEOUS) ×1 IMPLANT
LABEL OR SOLS (LABEL) IMPLANT
NEEDLE VERESS 14GA 120MM (NEEDLE) ×3 IMPLANT
NS IRRIG 500ML POUR BTL (IV SOLUTION) ×3 IMPLANT
PACK GYN LAPAROSCOPIC (MISCELLANEOUS) ×3 IMPLANT
PAD PREP 24X41 OB/GYN DISP (PERSONAL CARE ITEMS) ×3 IMPLANT
POUCH SPECIMEN RETRIEVAL 10MM (ENDOMECHANICALS) IMPLANT
SCISSORS METZENBAUM CVD 33 (INSTRUMENTS) IMPLANT
SET TUBE SMOKE EVAC HIGH FLOW (TUBING) ×3 IMPLANT
SHEARS HARMONIC ACE PLUS 36CM (ENDOMECHANICALS) ×3 IMPLANT
SLEEVE ENDOPATH XCEL 5M (ENDOMECHANICALS) IMPLANT
SPONGE GAUZE 2X2 8PLY STER LF (GAUZE/BANDAGES/DRESSINGS) ×3
SPONGE GAUZE 2X2 8PLY STRL LF (GAUZE/BANDAGES/DRESSINGS) ×6 IMPLANT
STRAP SAFETY 5IN WIDE (MISCELLANEOUS) IMPLANT
SUT MNCRL 3 0 RB1 (SUTURE) ×1 IMPLANT
SUT MONOCRYL 3 0 RB1 (SUTURE) ×2
SUT VIC AB 2-0 UR6 27 (SUTURE) IMPLANT
SUT VIC AB 4-0 PS2 18 (SUTURE) IMPLANT
SUT VICRYL 2-0 27IN ABS (SUTURE) ×3 IMPLANT
SYR 10ML LL (SYRINGE) ×3 IMPLANT
TROCAR ENDO BLADELESS 11MM (ENDOMECHANICALS) ×3 IMPLANT
TROCAR XCEL NON-BLD 5MMX100MML (ENDOMECHANICALS) ×3 IMPLANT

## 2019-03-21 NOTE — ED Notes (Signed)
Report given to OR Rn Gearldine Bienenstock

## 2019-03-21 NOTE — Transfer of Care (Signed)
Immediate Anesthesia Transfer of Care Note  Patient: Megan Newman  Procedure(s) Performed: LAPAROSCOPY DIAGNOSTIC, right sapingostomy (N/A Abdomen)  Patient Location: PACU  Anesthesia Type:General  Level of Consciousness: awake, alert  and oriented  Airway & Oxygen Therapy: Patient Spontanous Breathing  Post-op Assessment: Report given to RN and Post -op Vital signs reviewed and stable  Post vital signs: Reviewed and stable  Last Vitals:  Vitals Value Taken Time  BP    Temp    Pulse    Resp    SpO2      Last Pain:  Vitals:   03/21/19 1739  TempSrc:   PainSc: 0-No pain         Complications: No apparent anesthesia complications

## 2019-03-21 NOTE — Anesthesia Preprocedure Evaluation (Signed)
Anesthesia Evaluation  Patient identified by MRN, date of birth, ID band Patient awake    Reviewed: Allergy & Precautions, NPO status , Patient's Chart, lab work & pertinent test results, reviewed documented beta blocker date and time   Airway Mallampati: III  TM Distance: >3 FB     Dental  (+) Chipped   Pulmonary           Cardiovascular      Neuro/Psych Anxiety    GI/Hepatic   Endo/Other    Renal/GU      Musculoskeletal   Abdominal   Peds  Hematology   Anesthesia Other Findings   Reproductive/Obstetrics                             Anesthesia Physical Anesthesia Plan  ASA: III  Anesthesia Plan: General   Post-op Pain Management:    Induction: Intravenous  PONV Risk Score and Plan:   Airway Management Planned: Oral ETT  Additional Equipment:   Intra-op Plan:   Post-operative Plan:   Informed Consent: I have reviewed the patients History and Physical, chart, labs and discussed the procedure including the risks, benefits and alternatives for the proposed anesthesia with the patient or authorized representative who has indicated his/her understanding and acceptance.       Plan Discussed with: CRNA  Anesthesia Plan Comments:         Anesthesia Quick Evaluation

## 2019-03-21 NOTE — Anesthesia Post-op Follow-up Note (Signed)
Anesthesia QCDR form completed.        

## 2019-03-21 NOTE — ED Provider Notes (Signed)
[facility] Emergency Department Provider Note  ____________________________________________   I have reviewed the triage vital signs and the nursing notes.   HISTORY  Chief Complaint Vaginal Bleeding   History limited by: Not Limited   HPI Megan Newman is a 28 y.o. female who presents to the emergency department today because of concerns for vaginal bleeding in the setting of early pregnancy.  She thinks she is roughly 6-[redacted] weeks pregnant by beta-hCG.  She states that she has been having some spotting over the past few days.  Today however the bleeding was different.  She stated that she noticed heavier bleeding.  She denies any significant pain with this bleeding.  This is the patient's first pregnancy.  She denies any known history of bleeding disorders.   Records reviewed. Per medical record review patient has a history of ovarian cyst.   Past Medical History:  Diagnosis Date  . Anxiety   . Ovarian cyst     There are no active problems to display for this patient.   Past Surgical History:  Procedure Laterality Date  . arthroscopic knee surgery (Right knee)     right knee    Prior to Admission medications   Medication Sig Start Date End Date Taking? Authorizing Provider  acetaminophen (TYLENOL) 325 MG tablet Take 650 mg by mouth every 6 (six) hours as needed (pain).    [provider]  desogestrel-ethinyl estradiol (APRI,EMOQUETTE,SOLIA) 0.15-30 MG-MCG tablet Take 1 tablet by mouth daily. 06/08/15   [provider]  rizatriptan (MAXALT) 10 MG tablet Take 10 mg by mouth as needed for migraine. May repeat in 2 hours if needed    [provider]  sertraline (ZOLOFT) 50 MG tablet Take 50 mg by mouth daily.    [provider]    Allergies Cephalosporins  No family history on file.  Social History Social History   Tobacco Use  . Smoking status: Never Smoker  Substance Use Topics  . Alcohol use: Yes     Comment: occassional  . Drug use: No    Review of Systems Constitutional: No fever/chills Eyes: No visual changes. ENT: No sore throat. Cardiovascular: Denies chest pain. Respiratory: Denies shortness of breath. Gastrointestinal: No abdominal pain.  No nausea, no vomiting.  No diarrhea.   Genitourinary: Positive for vaginal bleeding.  Musculoskeletal: Negative for back pain. Skin: Negative for rash. Neurological: Negative for headaches, focal weakness or numbness.  ____________________________________________   PHYSICAL EXAM:  VITAL SIGNS: ED Triage Vitals [03/21/19 1729]  Enc Vitals Group     BP 128/84     Pulse Rate 83     Resp 16     Temp 98.1 F (36.7 C)     Temp Source Oral     SpO2 100 %     Weight      Height      Head Circumference      Peak Flow      Pain Score 0   Constitutional: Alert and oriented.  Eyes: Conjunctivae are normal.  ENT      Head: Normocephalic and atraumatic.      Nose: No congestion/rhinnorhea.      Mouth/Throat: Mucous membranes are moist.      Neck: No stridor. Hematological/Lymphatic/Immunilogical: No cervical lymphadenopathy. Cardiovascular: Normal rate, regular rhythm.  No murmurs, rubs, or gallops.  Respiratory: Normal respiratory effort without tachypnea nor retractions. Breath sounds are clear and equal bilaterally. No wheezes/rales/rhonchi. Gastrointestinal: Soft and tender to palpation in the suprapubic  region. Genitourinary: Deferred Musculoskeletal: Normal range of motion in all extremities. No lower extremity edema. Neurologic:  Normal speech and language. No gross focal neurologic deficits are appreciated.  Skin:  Skin is warm, dry and intact. No rash noted. Psychiatric: Mood and affect are normal. Speech and behavior are normal. Patient exhibits appropriate insight and judgment.  ____________________________________________    LABS (pertinent positives/negatives)  hcg 5,700 Upreg  positive  ____________________________________________   EKG  None  ____________________________________________    RADIOLOGY  US transvaginal Concern for free fluid, right adnexal ectopic pregnancy  ____________________________________________   PROCEDURES  Procedures  ____________________________________________   INITIAL IMPRESSION / ASSESSMENT AND PLAN / ED COURSE  Pertinent labs & imaging results that were available during my care of the patient were reviewed by me and considered in my medical decision making (see chart for details).   Patient presented to the emergency department today because of concerns for vaginal bleeding in the setting of early pregnancy.  Patient has not yet had an ultrasound.  On exam she did have some tenderness in the suprapubic region.  Ultrasound was performed which is concerning for an ectopic pregnancy.  I discussed this with the patient.  Dr. Tiburcio Pea with OB/GYN was consulted who will take the patient to the operating room.  ____________________________________________   FINAL CLINICAL IMPRESSION(S) / ED DIAGNOSES  Final diagnoses:  Vaginal bleeding during pregnancy     Note: This dictation was prepared with Dragon dictation. Any transcriptional errors that result from this process are unintentional     Phineas Semen, MD 03/21/19 2125

## 2019-03-21 NOTE — Discharge Instructions (Signed)
Diagnostic Laparoscopy, Care After  This sheet gives you information about how to care for yourself after your procedure. Your health care provider may also give you more specific instructions. If you have problems or questions, contact your health care provider.  What can I expect after the procedure?  After the procedure, it is common to have:   Mild discomfort in the abdomen.   Sore throat.  Women who have laparoscopy with pelvic examination may have mild cramping and fluid coming from the vagina for a few days after the procedure.  Follow these instructions at home:  Medicines   Take over-the-counter and prescription medicines only as told by your health care provider.   If you were prescribed an antibiotic medicine, take it as told by your health care provider. Do not stop taking the antibiotic even if you start to feel better.  Driving   Do not drive for 24 hours if you were given a medicine to help you relax (sedative) during your procedure.   Do not drive or use heavy machinery while taking prescription pain medicine.  Bathing   Do not take baths, swim, or use a hot tub until your health care provider approves. You may take showers.  Incision care     Follow instructions from your health care provider about how to take care of your incisions. Make sure you:  ? Wash your hands with soap and water before you change your bandage (dressing). If soap and water are not available, use hand sanitizer.  ? Change your dressing as told by your health care provider.  ? Leave stitches (sutures), skin glue, or adhesive strips in place. These skin closures may need to stay in place for 2 weeks or longer. If adhesive strip edges start to loosen and curl up, you may trim the loose edges. Do not remove adhesive strips completely unless your health care provider tells you to do that.   Check your incision areas every day for signs of infection. Check for:  ? Redness, swelling, or pain.  ? Fluid or  blood.  ? Warmth.  ? Pus or a bad smell.  Activity   Return to your normal activities as told by your health care provider. Ask your health care provider what activities are safe for you.   Do not lift anything that is heavier than 10 lb (4.5 kg), or the limit that you are told, until your health care provider says that it is safe.  General instructions   To prevent or treat constipation while you are taking prescription pain medicine, your health care provider may recommend that you:  ? Drink enough fluid to keep your urine pale yellow.  ? Take over-the-counter or prescription medicines.  ? Eat foods that are high in fiber, such as fresh fruits and vegetables, whole grains, and beans.  ? Limit foods that are high in fat and processed sugars, such as fried and sweet foods.   Do not use any products that contain nicotine or tobacco, such as cigarettes and e-cigarettes. If you need help quitting, ask your health care provider.   Keep all follow-up visits as told by your health care provider. This is important.  Contact a health care provider if:   You develop shoulder pain.   You feel lightheaded or faint.   You are unable to pass gas or have a bowel movement.   You feel nauseous or you vomit.   You develop a rash.   You have redness, swelling,   or pain around any incision.   You have fluid or blood coming from any incision.   Any incision feels warm to the touch.   You have pus or a bad smell coming from any incision.   You have a fever or chills.  Get help right away if:   You have severe pain.   You have vomiting that does not go away.   You have heavy bleeding from the vagina.   Any incision opens.   You have trouble breathing.   You have chest pain.  Summary   After the procedure, it is common to have mild discomfort in the abdomen and a sore throat.   Check your incision areas every day for signs of infection.   Return to your normal activities as told by your health care provider. Ask  your health care provider what activities are safe for you.  This information is not intended to replace advice given to you by your health care provider. Make sure you discuss any questions you have with your health care provider.  Document Released: 11/27/2015 Document Revised: 06/11/2017 Document Reviewed: 06/11/2017  Elsevier Interactive Patient Education  2019 [employer]

## 2019-03-21 NOTE — ED Notes (Signed)
Dr. Tiburcio Pea here to see pt.

## 2019-03-21 NOTE — H&P (Signed)
Obstetrics & Gynecology History and Physical Note  Date of Consultation: 03/21/2019   Requesting Provider: [facility]  Primary OBGYN: [facility] Primary Care Provider: Tomasa Rand  Reason for Consultation: Vaginal bleeding in early pregnancy  History of Present Illness: Megan Newman is a 28 y.o. G1P0 LMP over aq month ago, with the above CC.  She has not yet received prenatal care, other than lab visit at her primary Ob/Gyn office ion [place].  She was well until noted some gas l;ike pains today and then bleeding which led her to come to ER.  Min nausea.  No other sx's.  ROS: A review of systems was performed and was complete and comprehensive, except as stated in the above HPI.  OBGYN History: As per HPI. OB History    Gravida  1   Para      Term      Preterm      AB      Living        SAB      TAB      Ectopic      Multiple      Live Births               Past Medical History: Past Medical History:  Diagnosis Date  . Anxiety   . Ovarian cyst     Past Surgical History: Past Surgical History:  Procedure Laterality Date  . arthroscopic knee surgery (Right knee)     right knee    Family History:  No family history on file. She denies any female cancers, bleeding or blood clotting disorders.   Social History:  Social History   Socioeconomic History  . Marital status: Married    Spouse name: Not on file  . Number of children: Not on file  . Years of education: Not on file  . Highest education level: Not on file  Occupational History  . Not on file  Social Needs  . Financial resource strain: Not on file  . Food insecurity:    Worry: Not on file    Inability: Not on file  . Transportation needs:    Medical: Not on file    Non-medical: Not on file  Tobacco Use  . Smoking status: Never Smoker  Substance and Sexual Activity  . Alcohol use: Yes    Comment: occassional  . Drug use: No  . Sexual activity: Not on file  Lifestyle  . Physical  activity:    Days per week: Not on file    Minutes per session: Not on file  . Stress: Not on file  Relationships  . Social connections:    Talks on phone: Not on file    Gets together: Not on file    Attends religious service: Not on file    Active member of club or organization: Not on file    Attends meetings of clubs or organizations: Not on file    Relationship status: Not on file  . Intimate partner violence:    Fear of current or ex partner: Not on file    Emotionally abused: Not on file    Physically abused: Not on file    Forced sexual activity: Not on file  Other Topics Concern  . Not on file  Social History Narrative  . Not on file    Allergy: Allergies  Allergen Reactions  . Cephalosporins Rash    Current Outpatient Medications: (Not in a hospital admission)   Hospital Medications: No  current facility-administered medications for this encounter.    Current Outpatient Medications  Medication Sig Dispense Refill  . acetaminophen (TYLENOL) 325 MG tablet Take 650 mg by mouth every 6 (six) hours as needed (pain).    . rizatriptan (MAXALT) 10 MG tablet Take 10 mg by mouth as needed for migraine. May repeat in 2 hours if needed    . sertraline (ZOLOFT) 50 MG tablet Take 50 mg by mouth daily.      Physical Exam: Vitals:   03/21/19 1729 03/21/19 2023 03/21/19 2024 03/21/19 2030  BP: 128/84 136/73  125/80  Pulse: 83 87 89 83  Resp: 16     Temp: 98.1 F (36.7 C)     TempSrc: Oral     SpO2: 100% 100% 100% 100%    Temp:  [98.1 F (36.7 C)] 98.1 F (36.7 C) (03/22 1729) Pulse Rate:  [83-89] 83 (03/22 2030) Resp:  [16] 16 (03/22 1729) BP: (125-136)/(73-84) 125/80 (03/22 2030) SpO2:  [100 %] 100 % (03/22 2030) No intake/output data recorded. No intake/output data recorded. No intake or output data in the 24 hours ending 03/21/19 2046  There is no height or weight on file to calculate BMI. Constitutional: Well nourished, well developed female in no acute  distress.  HEENT: normal Neck:  Supple, normal appearance, and no thyromegaly  Cardiovascular:Regular rate and rhythm.  No murmurs, rubs or gallops. Respiratory:  Clear to auscultation bilateral. Normal respiratory effort Abdomen: positive bowel sounds and no masses, hernias; diffusely non tender to palpation, non distended Neuro: grossly intact Psych:  Normal mood and affect.  Skin:  Warm and dry.  MS: normal gait and normal bilateral lower extremity strength/ROM/symmetry Lymphatic:  No inguinal lymphadenopathy.   Results for orders placed or performed during the hospital encounter of 03/21/19  hCG, quantitative, pregnancy  Result Value Ref Range   hCG, Beta Chain, Quant, S 5,700 (H) <5 mIU/mL  POC urine preg, ED  Result Value Ref Range   Preg Test, Ur Positive (A) Negative  ABO/Rh  Result Value Ref Range   ABO/RH(D) PENDING   ABO/Rh  Result Value Ref Range   ABO/RH(D)      AB POS Performed at [facility], [address] [place], [place] [address]   Type and screen [facility]  Result Value Ref Range   ABO/RH(D) PENDING    Antibody Screen PENDING    Sample Expiration      03/24/2019 Performed at [facility], [address] [place], [place] [address]    Imaging:  Ultrasound independently reviewed/interpreted by self. CLINICAL DATA:  Pregnant patient in first-trimester pregnancy with pain and vaginal bleeding. Beta HCG 5,700.  EXAM: OBSTETRIC <14 WK US AND TRANSVAGINAL OB US  TECHNIQUE: Both transabdominal and transvaginal ultrasound examinations were performed for complete evaluation of the gestation as well as the maternal uterus, adnexal regions, and pelvic cul-de-sac. Transvaginal technique was performed to assess early pregnancy.  COMPARISON:  None.  FINDINGS: Intrauterine gestational sac: None  Yolk sac:  Not Visualized.  Embryo:  Not Visualized.  Cardiac Activity: Not Visualized.  Subchorionic  hemorrhage:  Not applicable  Maternal uterus/adnexae: The endometrium is thin. No fluid or gestational sac in the endometrial canal. Within the right adnexa is a 1.2 x 1.1 cm echogenic structure with round central anechoic area. This appears superior to the right ovary which measures 3.0 x 2.0 x 1.8 cm and may contain a corpus luteal cyst. Moderate to large amount of free fluid in  the pelvis, some of which appears complex. The left ovary measures 2.8 x 1.3 x 1.7 cm and appears normal.  IMPRESSION: Findings suspicious for right adnexal ectopic pregnancy. No intrauterine gestation with possible right adnexal ectopic superior to the right ovary and moderate to large amount of free fluid in the pelvis.   Assessment: Megan Newman is a 28 y.o. G1P0 (No LMP recorded. Patient is pregnant.) who presented to the ED with complaints of pain and bleeding; findings are consistent with right ectopic pregnancy.  Plan: Options for management discussed Methotrexate vs surgery, and due to nature of bleeding and beta levels, will proceed with laparoscopy and salpingostomy vs salpingectomy based on findings; this is discussed w patient.  The risks of surgery discussed with the patient included but were not limited to: bleeding which may require transfusion or reoperation; infection which may require antibiotics; injury to bowel, bladder, ureters or other surrounding organs; need for additional procedures including hysterectomy in the event of a life-threatening hemorrhage; incisional problems, thromboembolic phenomenon and other postoperative/anesthesia complications. The patient concurred with the proposed plan, giving informed written consent for the procedure. Fertility and recurrence risks also discussed regarding ectopic pregnancy.   Annamarie Major, MD, [facility], [facility] 03/21/2019  8:46 PM Pager [phone]

## 2019-03-21 NOTE — ED Notes (Signed)
Pt states she is having bright red blood, only has had to change pad once today. First pregnancy, denies cramping or abd pain.

## 2019-03-21 NOTE — ED Notes (Signed)
Pt going for UltraSound

## 2019-03-21 NOTE — ED Triage Notes (Signed)
Pt c/o vaginal bleeding that started today. PT states she is 6-7wks preg. PT denies any abd pain. PT states is wearing a panty liner and changed it once today. NAD noted . VSS

## 2019-03-21 NOTE — ED Notes (Signed)
Pt changing in preparation for surgery.

## 2019-03-21 NOTE — Op Note (Signed)
Megan Newman PROCEDURE DATE: 03/21/2019  PREOPERATIVE DIAGNOSIS: Ruptured ectopic pregnancy POSTOPERATIVE DIAGNOSIS: Ruptured right fallopian tube ectopic pregnancy PROCEDURE: Laparoscopic right salpingostomy and removal of ectopic pregnancy SURGEON:  Dr. Annamarie Major ANESTHESIOLOGY: General  INDICATIONS: 28 y.o. G1P0 at Unknown here with the preoperative diagnoses as listed above.  Please refer to preoperative notes for more details. Patient was counseled regarding need for laparoscopic salpingectomy. Risks of surgery including bleeding which may require transfusion or reoperation, infection, injury to bowel or other surrounding organs, need for additional procedures including laparotomy and other postoperative/anesthesia complications were explained to patient.  Written informed consent was obtained.  FINDINGS:  Moderate amount of hemoperitoneum estimated to be about 100 mL of blood and clots.  Dilated right fallopian tube containing ectopic gestation. Small normal appearing uterus, normal left fallopian tube, left ovary and right ovary.  ANESTHESIA: General ESTIMATED BLOOD LOSS: 100 ml URINE OUTPUT: 100 ml SPECIMENS: Right fallopian tube contents of clot and ectopic gestation COMPLICATIONS: None immediate  PROCEDURE IN DETAIL:  The patient was taken to the operating room where general anesthesia was administered and was found to be adequate.  She was placed in the dorsal lithotomy position, and was prepped and draped in a sterile manner.  A Foley catheter was inserted into her bladder and attached to constant drainage and a sponge stick was placed vaginally for any manipulation purposes.  After an adequate timeout was performed, attention was turned to the abdomen where an umbilical incision was made with the scalpel.  Veress needle is placed with confirmation using the hanging drop technique.   The abdomen was then insufflated with carbon dioxide gas and adequate pneumoperitoneum was  obtained.  The 5-mm trocar and sleeve were then advanced without difficulty with the laparoscope under direct visualization into the abdomen.   A survey of the patient's pelvis and abdomen revealed the findings above.  A 11-mm right lower quadrant port was then placed under direct visualization. Additional 5-mm trocar placed in suprapubic region. The suction irrigator was then used to suction the hemoperitoneum and irrigate the pelvis.  Attention was then turned to the right fallopian tube which was grasped anda linear salpingostomy dissection is performed using the Harmonic instrument.  The products and clots are removed and aspirated.  Good hemostasis was noted. Right lower quadrant fascia is closed with a 0 vicryl suture.  The abdomen was desufflated, and all instruments were removed.  All skin incisions were closed Dermabond. The patient tolerated the procedure well.  All instruments, needles, and sponge counts were correct x 2. The patient was taken to the recovery room in stable condition.   The patient will be discharged to home as per PACU criteria.  Routine postoperative instructions given.   Annamarie Major, MD, [facility], [facility] 03/21/2019  10:34 PM

## 2019-03-21 NOTE — Anesthesia Procedure Notes (Signed)
Procedure Name: Intubation Date/Time: 03/21/2019 9:38 PM Performed by: Chanetta Marshall, CRNA Pre-anesthesia Checklist: Patient identified, Emergency Drugs available, Suction available and Patient being monitored Patient Re-evaluated:Patient Re-evaluated prior to induction Oxygen Delivery Method: Circle system utilized Preoxygenation: Pre-oxygenation with 100% oxygen Induction Type: IV induction Ventilation: Mask ventilation without difficulty Laryngoscope Size: Mac and 3 Grade View: Grade I Tube type: Oral Tube size: 7.5 mm Number of attempts: 1 Placement Confirmation: ETT inserted through vocal cords under direct vision,  positive ETCO2,  CO2 detector and breath sounds checked- equal and bilateral Secured at: 21 cm Tube secured with: Tape Dental Injury: Teeth and Oropharynx as per pre-operative assessment

## 2019-03-22 ENCOUNTER — Encounter: Payer: Self-pay | Admitting: Obstetrics & Gynecology

## 2019-03-22 NOTE — Anesthesia Postprocedure Evaluation (Signed)
Anesthesia Post Note  Patient: Megan Newman  Procedure(s) Performed: LAPAROSCOPY DIAGNOSTIC, right sapingostomy (N/A Abdomen)  Patient location during evaluation: PACU Anesthesia Type: General Level of consciousness: awake and alert Pain management: pain level controlled Vital Signs Assessment: post-procedure vital signs reviewed and stable Respiratory status: spontaneous breathing, nonlabored ventilation, respiratory function stable and patient connected to nasal cannula oxygen Cardiovascular status: blood pressure returned to baseline and stable Postop Assessment: no apparent nausea or vomiting Anesthetic complications: no     Last Vitals:  Vitals:   03/21/19 2303 03/21/19 2318  BP: 100/64 107/64  Pulse: 90 91  Resp: 15 12  Temp:  36.6 C  SpO2: 100% 100%    Last Pain:  Vitals:   03/21/19 2318  TempSrc:   PainSc: 3                  Janette Harvie S

## 2019-03-23 LAB — SURGICAL PATHOLOGY

## 2019-03-29 ENCOUNTER — Telehealth: Payer: Self-pay

## 2019-03-29 NOTE — Telephone Encounter (Signed)
Pt called triage reporting a burning feeling above her incision pain is a 7, pt states she does not have a fever and the incision looks fine , she has took some ibuprofen, feels a little better . Per SDJ some burning can be normal, pt aware I would send you a message to see if you had any other advice.

## 2019-03-30 NOTE — Telephone Encounter (Signed)
Pt states she is better today, she will let us know if pain gets worse

## 2019-03-30 NOTE — Telephone Encounter (Signed)
We can see her sooner than Apr 6 (for post op) if she still has these concerns

## 2019-04-04 ENCOUNTER — Encounter: Payer: Self-pay | Admitting: Obstetrics & Gynecology

## 2019-04-05 ENCOUNTER — Other Ambulatory Visit: Payer: Self-pay

## 2019-04-05 ENCOUNTER — Encounter: Payer: Self-pay | Admitting: Obstetrics & Gynecology

## 2019-04-05 ENCOUNTER — Ambulatory Visit (INDEPENDENT_AMBULATORY_CARE_PROVIDER_SITE_OTHER): Payer: Managed Care, Other (non HMO) | Admitting: Obstetrics & Gynecology

## 2019-04-05 VITALS — BP 120/80 | Ht 66.0 in | Wt 196.0 lb

## 2019-04-05 DIAGNOSIS — Z9889 Other specified postprocedural states: Secondary | ICD-10-CM

## 2019-04-05 DIAGNOSIS — K661 Hemoperitoneum: Secondary | ICD-10-CM

## 2019-04-05 DIAGNOSIS — O00101 Right tubal pregnancy without intrauterine pregnancy: Principal | ICD-10-CM

## 2019-04-05 NOTE — Progress Notes (Signed)
  Postoperative Follow-up Patient presents post op from laparoscopy and rigth salpingostomy for right ruptured ectopic pregnancy, 2 weeks ago.  Subjective: Patient reports marked improvement in her preop symptoms. Eating a regular diet without difficulty. The patient is not having any pain.  Activity: normal activities of daily living. Patient reports additional symptom's since surgery of No bleeding and no period yet.  Some numbmess in fingers that is unusual and occurred since surgery.  Desires future pregnancy  Objective: BP 120/80   Ht 5\' 6"  (1.676 m)   Wt 196 lb (88.9 kg)   BMI 31.64 kg/m  Physical Exam Constitutional:      General: She is not in acute distress.    Appearance: She is well-developed.  Cardiovascular:     Rate and Rhythm: Normal rate.  Pulmonary:     Effort: Pulmonary effort is normal.  Abdominal:     General: There is no distension.     Palpations: Abdomen is soft.     Tenderness: There is no abdominal tenderness.     Comments: Incision Healing Well   Musculoskeletal: Normal range of motion.  Neurological:     Mental Status: She is alert and oriented to person, place, and time.     Cranial Nerves: No cranial nerve deficit.  Skin:    General: Skin is warm and dry.    Assessment: s/p :  laparoscopy and right salpingostpmy for ectopic pregnancy progressing well  Plan: Patient has done well after surgery with no apparent complications.  I have discussed the post-operative course to date, and the expected progress moving forward.  The patient understands what complications to be concerned about.  I will see the patient in routine follow up, or sooner if needed.    Activity plan: No restriction. Plans pregnancy in the near future Counseled as to risks of future ectopic Also counseled as to risks of Corona virus and pregnancy, also the unknowns related to this situration currently  Due for PAP, plans to see primary Ob/Gyn in [place] for this (last year had  Colpo w biopsies related to abnormal PAP).  Letitia Libra 04/05/2019, 9:04 AM

## 2019-05-26 ENCOUNTER — Ambulatory Visit (INDEPENDENT_AMBULATORY_CARE_PROVIDER_SITE_OTHER): Payer: Managed Care, Other (non HMO) | Admitting: Obstetrics & Gynecology

## 2019-05-26 ENCOUNTER — Encounter: Payer: Self-pay | Admitting: Obstetrics & Gynecology

## 2019-05-26 ENCOUNTER — Other Ambulatory Visit: Payer: Self-pay

## 2019-05-26 VITALS — BP 110/70 | Ht 66.0 in | Wt 195.0 lb

## 2019-05-26 DIAGNOSIS — Z8759 Personal history of other complications of pregnancy, childbirth and the puerperium: Secondary | ICD-10-CM | POA: Insufficient documentation

## 2019-05-26 NOTE — Progress Notes (Signed)
  History of Present Illness:  Megan Newman is a 28 y.o. who recently underwent labwork for trending beta levels.  She had Ectopic pregnancy and has been watching levels after surgery.  She had irreg bleeding in April, had levels 110, then 50, then 9 (two weeks ago).  Had normal period one weeks ago.  No pain. Desires pregnancy in near future.  PMHx: She  has a past medical history of Anxiety and Ovarian cyst. Also,  has a past surgical history that includes arthroscopic knee surgery (Right knee) and laparoscopy (N/A, 03/21/2019)., family history is not on file.,  reports that she has never smoked. She has never used smokeless tobacco. She reports current alcohol use. She reports that she does not use drugs. No outpatient medications have been marked as taking for the 05/26/19 encounter (Office Visit) with Nadara Mustard, MD.  . Also, is allergic to cephalosporins..  Review of Systems  All other systems reviewed and are negative.   Physical Exam:  BP 110/70   Ht 5\' 6"  (1.676 m)   Wt 195 lb (88.5 kg)   LMP 05/17/2019   BMI 31.47 kg/m  Body mass index is 31.47 kg/m. Constitutional: Well nourished, well developed female in no acute distress.  Abdomen: diffusely non tender to palpation, non distended, and no masses, hernias Neuro: Grossly intact Psych:  Normal mood and affect.    Assessment:  Problem List Items Addressed This Visit      Other   History of ectopic pregnancy - Primary   Relevant Orders   Beta hCG quant (ref lab)     Plan: Detailed discussion of results today. Options for management discussed. Info provided. At this time, we will plan for BETA level today. Resume trying for pregnancy if normal/zero   [place] if still elevated She was amenable to this plan and we will see her back for annual/PRN.  A total of 15 minutes were spent face-to-face with the patient during this encounter and over half of that time dealt with counseling and coordination of care.  Megan Major, MD, Megan Newman Ob/Gyn, [facility] 05/26/2019  11:27 AM

## 2019-05-27 LAB — BETA HCG QUANT (REF LAB): hCG Quant: 1 m[IU]/mL

## 2019-06-28 LAB — FETAL NONSTRESS TEST

## 2019-07-05 LAB — FETAL NONSTRESS TEST

## 2019-11-18 ENCOUNTER — Telehealth: Payer: Self-pay

## 2019-11-18 NOTE — Telephone Encounter (Signed)
Pt calling; fell on steps thia am on buttocks; 5w.  [phone]  Adv pt baby is still behind the pubic bone protected.  More than likely baby is okay.  Pt states is not spotting or bleeding.  Adv to watch for that and if bleeds like a period she needs to be seen either here or ED.  Otherwise we will see her Monday for NOB appt.

## 2019-11-22 ENCOUNTER — Encounter: Payer: Self-pay | Admitting: Certified Nurse Midwife

## 2019-11-22 ENCOUNTER — Other Ambulatory Visit: Payer: Self-pay

## 2019-11-22 ENCOUNTER — Ambulatory Visit (INDEPENDENT_AMBULATORY_CARE_PROVIDER_SITE_OTHER): Payer: Managed Care, Other (non HMO) | Admitting: Certified Nurse Midwife

## 2019-11-22 ENCOUNTER — Other Ambulatory Visit (HOSPITAL_COMMUNITY)
Admission: RE | Admit: 2019-11-22 | Discharge: 2019-11-22 | Disposition: A | Discharge status: Home / Self Care | Payer: 59 | Source: Ambulatory Visit | Attending: Certified Nurse Midwife | Admitting: Certified Nurse Midwife

## 2019-11-22 VITALS — BP 100/70 | Ht 66.0 in | Wt 193.0 lb

## 2019-11-22 DIAGNOSIS — Z3480 Encounter for supervision of other normal pregnancy, unspecified trimester: Secondary | ICD-10-CM

## 2019-11-22 DIAGNOSIS — Z6831 Body mass index (BMI) 31.0-31.9, adult: Secondary | ICD-10-CM

## 2019-11-22 DIAGNOSIS — O99210 Obesity complicating pregnancy, unspecified trimester: Secondary | ICD-10-CM

## 2019-11-22 DIAGNOSIS — Z8759 Personal history of other complications of pregnancy, childbirth and the puerperium: Secondary | ICD-10-CM

## 2019-11-22 DIAGNOSIS — Z3A01 Less than 8 weeks gestation of pregnancy: Secondary | ICD-10-CM | POA: Diagnosis present

## 2019-11-22 DIAGNOSIS — O09291 Supervision of pregnancy with other poor reproductive or obstetric history, first trimester: Secondary | ICD-10-CM

## 2019-11-22 LAB — POCT URINALYSIS DIPSTICK OB
Glucose, UA: NEGATIVE
POC,PROTEIN,UA: NEGATIVE

## 2019-11-22 NOTE — Progress Notes (Addendum)
New Obstetric Patient H&P    Chief Complaint: "Desires prenatal care"   History of Present Illness: Megan Newman is a 28 y.o. G2P0010  female, LMP 10/12/2019 presents with amenorrhea and positive home pregnancy test. Based on her  LMP, her EDD is 07/18/2020 and her EGA is 5wk6d. Cycles are 6. days, slightly irregular, and occur approximately every : 35-40 days. Her last pap smear was 4 months ago and was ASCUS with positive HRHPV. Has a history of abnormal Pap smears. Had a colposcopy in March 2019 which showed reactive atypia.    She had a urine pregnancy test which was positive 1 week(s)  Ago (11/14/2019). Her last menstrual period was normal and lasted for  6 day(s). Since her LMP she claims she has experienced fatigue, headaches, mild transient nausea, and cramping. She denies vaginal bleeding. 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 Her prior pregnancies are notable for an ectopic pregnancy in March 2020. She had a ruptured right ectopic and had a lap salpingostomy 03/21/2019.   Since her LMP, she admits to the use of tobacco products  No Since her +UPT, she has not had alcohol She denies use of illicit substances. She claims she has lost  2 pounds since the start of her pregnancy.  There are cats in the home in the home  no If yes NA She admits close contact with children on a regular basis  no  She has had chicken pox in the past yes She has had Tuberculosis exposures, symptoms, or previously tested positive for TB   no Current or past history of domestic violence. no  Genetic Screening/Teratology Counseling: (Includes patient, baby's father, or anyone in either family with:)   1. Patient's age >/= 85 at [facility]  no 2. Thalassemia ([place], [place], Mediterranean, or Asian background): MCV<80  no 3. Neural tube defect  (meningomyelocele, spina bifida, anencephaly)  no 4. Congenital heart defect  no  5. Down syndrome  no 6. Tay-Sachs (Jewish, [place]  no 7. Canavan's Disease  no 8. Sickle cell disease or trait (African)  no 9. Hemophilia or other blood disorders  no  10. Muscular dystrophy  Yes, remote family hx on maternal side-second or third degree cousins 76. Cystic fibrosis  no  12. Huntington's Chorea  no  13. Mental retardation/autism  no 14. Other inherited genetic or chromosomal disorder  no 15. Maternal metabolic disorder (DM, PKU, etc)  no 16. Patient or FOB with a child with a birth defect not listed above no  16a. Patient or FOB with a birth defect themselves no 17. Recurrent pregnancy loss, or stillbirth  no  18. Any medications since LMP other than prenatal vitamins (include vitamins, supplements, OTC meds, drugs, alcohol) Yes, Bactrim DS, Sudafed, Claritin, prenatal vitamins 19. Any other genetic/environmental exposure to discuss  no  Infection History:   1. Lives with someone with TB or TB exposed  no  2. Patient or partner has history of genital herpes  no 3. Rash or viral illness since LMP  no 4. History of STI (GC, CT, HPV, syphilis, HIV)  no 5. History of recent travel :  no  Other pertinent information:  Yes, husband Merton Border "Sidonie Dickens" is age 27 and will be involved with her care.    Review of Systems:10 point review of systems negative unless otherwise noted in  HPI  Past Medical History:  Past Medical History:  Diagnosis Date  . Anxiety   . Ectopic pregnancy 02/2019   Ruptured right ectopic-had lap salpingostomy  . Migraine   . Ovarian cyst     Past Surgical History:  Past Surgical History:  Procedure Laterality Date  . arthroscopic knee surgery (Right knee)     right knee  . LAPAROSCOPY N/A 03/21/2019   Procedure: LAPAROSCOPY DIAGNOSTIC, right sapingostomy;  Surgeon: Gae Dry, MD;  Location: [facility] ORS;  Service: Gynecology;  Laterality: N/A;  .  MYRINGOTOMY Bilateral     Gynecologic History: Patient's last menstrual period was 10/12/2019 (exact date).  Obstetric History: G2P0010  Family History:  Family History  Problem Relation Age of Onset  . Breast cancer Mother 29  . Lung cancer Maternal Grandmother   . Heart disease Paternal Grandmother     Social History:  Social History   Socioeconomic History  . Marital status: Married    Spouse name: Arnell Sieving "Shaun"  . Number of children: Not on file  . Years of education: 56  . Highest education level: Not on file  Occupational History  . Occupation: Community education officer  Social Needs  . Financial resource strain: Not on file  . Food insecurity    Worry: Not on file    Inability: Not on file  . Transportation needs    Medical: Not on file    Non-medical: Not on file  Tobacco Use  . Smoking status: Never Smoker  . Smokeless tobacco: Never Used  Substance and Sexual Activity  . Alcohol use: Not Currently    Comment: occassional  . Drug use: No  . Sexual activity: Yes    Birth control/protection: None  Lifestyle  . Physical activity    Days per week: Not on file    Minutes per session: Not on file  . Stress: Not on file  Relationships  . Social Herbalist on phone: Not on file    Gets together: Not on file    Attends religious service: Not on file    Active member of club or organization: Not on file    Attends meetings of clubs or organizations: Not on file    Relationship status: Not on file  . Intimate partner violence    Fear of current or ex partner: Not on file    Emotionally abused: Not on file    Physically abused: Not on file    Forced sexual activity: Not on file  Other Topics Concern  . Not on file  Social History Narrative  . Not on file    Allergies:  Allergies  Allergen Reactions  . Cefixime Rash    Medications: Current Outpatient Medications:  .  acetaminophen (TYLENOL) 325 MG tablet, Take 650 mg by mouth every 6 (six) hours as  needed (pain)., Disp: , Rfl:  .  Prenatal 28-0.8 MG TABS, Take 1 tablet by mouth daily., Disp: , Rfl:  Physical Exam Vitals: BP 100/70   Wt 193 lb (87.5 kg)   LMP 10/12/2019 (Exact Date)   BMI 31.15 kg/m   General: in  NAD HEENT: normocephalic, anicteric Thyroid: no enlargement, no palpable nodules Pulmonary: No increased work of breathing, CTAB Breasts: soft, nontender, no masses, no supraclavicular or infraclavicular lymadenopathy Cardiovascular: RRR, without murmur Abdomen: soft, non-tender, non-distended.  Umbilicus without lesions.  No hepatomegaly, splenomegaly or masses palpable. No evidence of hernia  Genitourinary:  External: Normal external female genitalia.  Normal urethral meatus, normal  Bartholin's and Skene's glands.    Vagina: Normal vaginal mucosa, no evidence of prolapse.    Cervix: Grossly normal in appearance, no bleeding  Uterus: AF, NSSC, mobile,NT  Adnexa: ovaries non-enlarged, no adnexal masses  Rectal: deferred Extremities: no edema, erythema, or tenderness Neurologic: Grossly intact Psychiatric: mood appropriate, affect full   Assessment: 28 y.o. G2P0010 at 5wk6d to initiate prenatal care Hx of ectopic pregnancy earlier this year. Obesity in pregnancy with BMI 31.15 kg/m2  Plan: 1) Informal TVUS done: there is a intrauterine gestational sac within which is a normal appearing yolk sac and a small fetal pole,about .09cm (OOR). Too small to see FCA. No masses were seen in the adnexal areas.  2) Avoid alcoholic beverages.  3) Discontinue the use of all non-medicinal drugs and chemicals.  4) Take prenatal vitamins daily.  5) Nutrition, food safety (fish, cheese advisories, and high nitrite foods) and exercise discussed. 6) Hospital and practice style discussed with cross coverage system.  7) Genetic Screening, such as with 1st Trimester Screening, cell free fetal DNA, AFP testing, and Ultrasound, as well as testing for recessive gene mutations is discussed  with patient. At the conclusion of today's visit patient is leaning toward  genetic testing 8) Patient is asked about travel to areas at risk for the Zika virus, and counseled to avoid travel and exposure to mosquitoes or sexual partners who may have themselves been exposed to the virus. Testing is discussed, and will be ordered as appropriate.  10) NOB labs, urine culture, UDS, Aptima today 11) RTO in 2 weeks for viability scan, 1 hour GTT and ROB.  Farrel Connersolleen Lyna Laningham, CNM

## 2019-11-22 NOTE — Progress Notes (Signed)
No [email or web address] 

## 2019-11-23 ENCOUNTER — Encounter: Payer: Self-pay | Admitting: Certified Nurse Midwife

## 2019-11-23 DIAGNOSIS — F419 Anxiety disorder, unspecified: Secondary | ICD-10-CM | POA: Insufficient documentation

## 2019-11-23 DIAGNOSIS — G43909 Migraine, unspecified, not intractable, without status migrainosus: Secondary | ICD-10-CM | POA: Insufficient documentation

## 2019-11-23 DIAGNOSIS — Z3480 Encounter for supervision of other normal pregnancy, unspecified trimester: Secondary | ICD-10-CM | POA: Insufficient documentation

## 2019-11-23 LAB — URINE DRUG PANEL 7
Amphetamines, Urine: NEGATIVE ng/mL
Barbiturate Quant, Ur: NEGATIVE ng/mL
Benzodiazepine Quant, Ur: NEGATIVE ng/mL
Cannabinoid Quant, Ur: NEGATIVE ng/mL
Cocaine (Metab.): NEGATIVE ng/mL
Opiate Quant, Ur: NEGATIVE ng/mL
PCP Quant, Ur: NEGATIVE ng/mL

## 2019-11-24 LAB — RPR+RH+ABO+RUB AB+AB SCR+CB...
Antibody Screen: NEGATIVE
HIV Screen 4th Generation wRfx: NONREACTIVE
Hematocrit: 38.4 % (ref 34.0–46.6)
Hemoglobin: 13.3 g/dL (ref 11.1–15.9)
Hepatitis B Surface Ag: NEGATIVE
MCH: 30.1 pg (ref 26.6–33.0)
MCHC: 34.6 g/dL (ref 31.5–35.7)
MCV: 87 fL (ref 79–97)
Platelets: 336 10*3/uL (ref 150–450)
RBC: 4.42 x10E6/uL (ref 3.77–5.28)
RDW: 12.9 % (ref 11.7–15.4)
RPR Ser Ql: NONREACTIVE
Rh Factor: POSITIVE
Rubella Antibodies, IGG: 13.8 index (ref >0.99)
Varicella zoster IgG: 2228 index (ref >165)
WBC: 9.3 10*3/uL (ref 3.4–10.8)

## 2019-11-24 LAB — CERVICOVAGINAL ANCILLARY ONLY
Chlamydia: NEGATIVE
Comment: NEGATIVE
Comment: NEGATIVE
Comment: NORMAL
Neisseria Gonorrhea: NEGATIVE
Trichomonas: NEGATIVE

## 2019-11-24 LAB — HEMOGLOBINOPATHY EVALUATION
HGB C: 0 %
HGB S: 0 %
HGB VARIANT: 0 %
Hemoglobin A2 Quantitation: 2.3 % (ref 1.8–3.2)
Hemoglobin F Quantitation: 0 % (ref 0.0–2.0)
Hgb A: 97.7 % (ref 96.4–98.8)

## 2019-11-25 LAB — URINE CULTURE

## 2019-12-13 ENCOUNTER — Encounter: Payer: Self-pay | Admitting: Obstetrics and Gynecology

## 2019-12-13 ENCOUNTER — Ambulatory Visit (INDEPENDENT_AMBULATORY_CARE_PROVIDER_SITE_OTHER): Payer: Managed Care, Other (non HMO)

## 2019-12-13 ENCOUNTER — Ambulatory Visit (INDEPENDENT_AMBULATORY_CARE_PROVIDER_SITE_OTHER): Payer: Managed Care, Other (non HMO) | Admitting: Obstetrics and Gynecology

## 2019-12-13 ENCOUNTER — Other Ambulatory Visit: Payer: Self-pay

## 2019-12-13 ENCOUNTER — Other Ambulatory Visit: Payer: Managed Care, Other (non HMO)

## 2019-12-13 VITALS — BP 114/70 | Wt 192.0 lb

## 2019-12-13 DIAGNOSIS — Z3A08 8 weeks gestation of pregnancy: Secondary | ICD-10-CM

## 2019-12-13 DIAGNOSIS — O09291 Supervision of pregnancy with other poor reproductive or obstetric history, first trimester: Secondary | ICD-10-CM

## 2019-12-13 DIAGNOSIS — N8312 Corpus luteum cyst of left ovary: Secondary | ICD-10-CM

## 2019-12-13 DIAGNOSIS — Z3480 Encounter for supervision of other normal pregnancy, unspecified trimester: Secondary | ICD-10-CM

## 2019-12-13 DIAGNOSIS — Z8759 Personal history of other complications of pregnancy, childbirth and the puerperium: Secondary | ICD-10-CM

## 2019-12-13 DIAGNOSIS — Z6831 Body mass index (BMI) 31.0-31.9, adult: Secondary | ICD-10-CM

## 2019-12-13 DIAGNOSIS — O3481 Maternal care for other abnormalities of pelvic organs, first trimester: Secondary | ICD-10-CM

## 2019-12-13 DIAGNOSIS — O99210 Obesity complicating pregnancy, unspecified trimester: Secondary | ICD-10-CM

## 2019-12-13 NOTE — Progress Notes (Signed)
Routine Prenatal Care Visit  Subjective  Megan Newman is a 28 y.o. G2P0010 at [redacted]w[redacted]d being seen today for ongoing prenatal care.  She is currently monitored for the following issues for this low-risk pregnancy and has Ruptured right tubal ectopic pregnancy causing hemoperitoneum; History of ectopic pregnancy; Migraine; Anxiety; and Supervision of other normal pregnancy, antepartum on their problem list.  ----------------------------------------------------------------------------------- Patient reports no complaints.    . Vag. Bleeding: None.   . Leaking Fluid denies.  U/S confirms EDD ----------------------------------------------------------------------------------- The following portions of the patient's history were reviewed and updated as appropriate: allergies, current medications, past family history, past medical history, past social history, past surgical history and problem list. Problem list updated.  Objective  Blood pressure 114/70, weight 192 lb (87.1 kg), last menstrual period 10/12/2019. Pregravid weight 195 lb (88.5 kg) Total Weight Gain -3 lb (-1.361 kg) Urinalysis: Urine Protein    Urine Glucose    Fetal Status: Fetal Heart Rate (bpm): Present ([place])         General:  Alert, oriented and cooperative. Patient is in no acute distress.  Skin: Skin is warm and dry. No rash noted.   Cardiovascular: Normal heart rate noted  Respiratory: Normal respiratory effort, no problems with respiration noted  Abdomen: Soft, gravid, appropriate for gestational age.       Pelvic:  Cervical exam deferred        Extremities: Normal range of motion.     Mental Status: Normal mood and affect. Normal behavior. Normal judgment and thought content.   Imaging Results US OB Comp Less 14 Wks  Addendum Date: 12/13/2019   ADDENDUM: Study performed at [facility]  Result Date: 12/13/2019 Patient Name: Megan Newman DOB: [birth year] MRN: [id number] ULTRASOUND REPORT Location:  [facility] Date of Service: 12/13/2019 Indications:dating Findings: Mason Jim intrauterine pregnancy is visualized with a CRL consistent with [redacted]w[redacted]d gestation, giving an (U/S) EDD of 07/252021. The (U/S) EDD is consistent with the clinically established EDD of 07/18/2020 FHR: 167 BPM CRL measurement: 17 mm Yolk sac is visualized and appears normal and early anatomy is normal. Amnion: visualized and appears normal Right Ovary is normal in appearance. Left Ovary is normal appearance. Corpus luteal cyst:  Left ovary Survey of the adnexa demonstrates no adnexal masses. There is no free peritoneal fluid in the cul de sac. Impression: 1. [redacted]w[redacted]d Viable Singleton Intrauterine pregnancy by U/S. 2. (U/S) EDD is consistent with Clinically established EDD of 07/18/2020. Jenine M. Alessi     RDMS There is a viable singleton gestation.  Detailed evaluation of the fetal anatomy is precluded by early gestational age.  It must be noted that a normal ultrasound particular at this early gestational age is unable to rule out fetal aneuploidy, risk of first trimester miscarriage, or anatomic birth defects. Thomasene Mohair, MD, [facility], [facility] 12/13/2019 3:36 PM      Assessment   28 y.o. G2P0010 at [redacted]w[redacted]d by  07/18/2020, by Last Menstrual Period presenting for routine prenatal visit  Plan   SECOND Problems (from 11/22/19 to present)    Problem Noted Resolved   Supervision of other normal pregnancy, antepartum 11/23/2019 by Farrel Conners, CNM No   Overview Signed 11/23/2019  1:07 PM by Farrel Conners, CNM    Clinic [facility] Prenatal Labs  Dating  Blood type: --/--/AB POS (03/22 2023)   Genetic Screen 1 Screen:    AFP:     Quad:     NIPS: Antibody:NEG (03/22 2023)  Anatomic [place]  Rubella:   Varicella: @VZVIGG @  GTT Early:               Third trimester:  RPR:     Rhogam  HBsAg:     TDaP vaccine                       Flu Shot: HIV:     Baby Food                                GBS:    Contraception  Pap:  CBB     CS/VBAC    Support Person           History of ectopic pregnancy 05/26/2019 by Gae Dry, MD No       Preterm labor symptoms and general obstetric precautions including but not limited to vaginal bleeding, contractions, leaking of fluid and fetal movement were reviewed in detail with the patient. Please refer to After Visit Summary for other counseling recommendations.   - Undecided about genetic testing  Return in about 4 weeks (around 01/10/2020) for Routine Prenatal Appointment.  Prentice Docker, MD, [facility], [facility] 12/13/2019 4:08 PM

## 2019-12-14 LAB — GLUCOSE, 1 HOUR GESTATIONAL: Gestational Diabetes Screen: 112 mg/dL (ref 65–139)

## 2019-12-31 NOTE — L&D Delivery Note (Signed)
°  PREOPERATIVE DIAGNOSES: 1. Term pregnancy at 40 wks and 4 days Gestational Age 30.  Arrest of Descent  POSTOPERATIVE DIAGNOSES: 1. Term pregnancy at 40 wks and 4 days Gestational Age 30. Live, Viable female infant 3.  Arrest of Descent  OPERATION PERFORMED: Vacuum-Assisted Vaginal Delivery  SURGEON: Adelene Idler, MD  ANESTHESIA: Epidural  ESTIMATED BLOOD LOSS: 800 cc  FINDINGS: Delivered a female weighing 8lbs 5 oz with Apgars 8/9, three-vessel cord and normal placenta.   COMPLICATIONS: None  SITUATION:  The arrest of descent was discussed with the patient and her partner, as were the delivery options including a Kiwi vacuum delivery. The pros and cons and the risks of the vacuum delivery were discussed in detail, as were the alternative approaches. The patient and her partner decided to proceed with a Kiwi vacuum delivery.   DESCRIPTION OF THE PROCEDURE:   The baby's head was confirmed to be in the LOP presentation with 100% effacement and +2 station. The perineum was injected with 1% lidocaine. The bladder was drained. The vacuum was placed and the correct placement in front of the posterior fontanelle was confirmed digitally. With the patient's next contraction, the vacuum was inflated and a gentle pressure was used to assist with maternal pushes to deliver the baby's head. In total there were 5 pulls and no pop-offs. The vacuum was released between contractions.  As the head was crowning an episiotomy was made. After delivery of the head, the vacuum was deflated and removed. There was leg cord.The anterior right shoulder was delivered with gentle downward guidance followed by delivery of the posterior left shoulder with gentle upward guidance. The infant was placed on the maternal chest.  The cord was clamped x2 and cut. The infant was handed to the neonatalogist.   Pitocin was added to the patient's IV fluids. The placenta delivered spontaneously, was intact and had a  three-vessel cord. A vaginal inspection revealed a stellate laceration with a left sulcal extension. The laceration  was repaired with #2-0 and #3-0 Vicryl suture in a running fashion with local anesthesia.   At the end of the delivery mom and baby were recovering in stable condition.  Sponge, instrument, and needle counts were correct times two.   Adelene Idler MD [facility], [facility] 07/22/20 8:49 PM

## 2020-01-10 ENCOUNTER — Encounter: Payer: Self-pay | Admitting: Obstetrics & Gynecology

## 2020-01-10 ENCOUNTER — Other Ambulatory Visit: Payer: Self-pay

## 2020-01-10 ENCOUNTER — Other Ambulatory Visit (HOSPITAL_COMMUNITY)
Admission: RE | Admit: 2020-01-10 | Discharge: 2020-01-10 | Disposition: A | Discharge status: Home / Self Care | Payer: Managed Care, Other (non HMO) | Source: Ambulatory Visit | Attending: Obstetrics & Gynecology | Admitting: Obstetrics & Gynecology

## 2020-01-10 ENCOUNTER — Ambulatory Visit (INDEPENDENT_AMBULATORY_CARE_PROVIDER_SITE_OTHER): Payer: Managed Care, Other (non HMO) | Admitting: Obstetrics & Gynecology

## 2020-01-10 VITALS — BP 120/80 | Wt 190.0 lb

## 2020-01-10 DIAGNOSIS — Z3480 Encounter for supervision of other normal pregnancy, unspecified trimester: Secondary | ICD-10-CM

## 2020-01-10 DIAGNOSIS — O99211 Obesity complicating pregnancy, first trimester: Secondary | ICD-10-CM

## 2020-01-10 DIAGNOSIS — Z3689 Encounter for other specified antenatal screening: Secondary | ICD-10-CM

## 2020-01-10 DIAGNOSIS — Z124 Encounter for screening for malignant neoplasm of cervix: Secondary | ICD-10-CM

## 2020-01-10 DIAGNOSIS — Z3A12 12 weeks gestation of pregnancy: Secondary | ICD-10-CM

## 2020-01-10 DIAGNOSIS — O99210 Obesity complicating pregnancy, unspecified trimester: Secondary | ICD-10-CM

## 2020-01-10 NOTE — Progress Notes (Signed)
Subjective  Fetal Movement? no Nausea? no Vaginal Bleeding? no  Objective  BP 120/80   Wt 190 lb (86.2 kg)   LMP 10/12/2019 (Exact Date)   BMI 30.67 kg/m  General: NAD Pumonary: no increased work of breathing Abdomen: gravid, non-tender Extremities: no edema Psychiatric: mood appropriate, affect full  Assessment  29 y.o. G2P0010 at [redacted]w[redacted]d by  07/18/2020, by Last Menstrual Period presenting for routine prenatal visit  Plan   Problem List Items Addressed This Visit      Other   Supervision of other normal pregnancy, antepartum    Other Visit Diagnoses    [redacted] weeks gestation of pregnancy    -  Primary   Obesity in pregnancy       Screening for cervical cancer       Relevant Orders   Cytology - PAP   Screening, antenatal, for fetal anatomic survey       Relevant Orders   US OB Comp + 14 Wk      SECOND Problems (from 11/22/19 to present)    Problem Noted Resolved   Supervision of other normal pregnancy, antepartum 11/23/2019 by Dalia Heading, CNM No   Overview Addendum 12/14/2019  5:11 PM by Dalia Heading, [place] Prenatal Labs  Dating [place] Blood type: AB/Positive/-- (11/23 1431)   Genetic Screen      NIPS:unsure Antibody:Negative (11/23 1431)  Anatomic [place]  Rubella: 13.80 (11/23 1431) Varicella:Immune  GTT Early:               Third trimester:  RPR: Non Reactive (11/23 1431)   Rhogam  HBsAg: Negative (11/23 1431)   TDaP vaccine       Flu Shot:09/2019 HIV: Non Reactive (11/23 1431)   Baby Food     Breast                           GBS:   Contraception     Unsure Pap:1/21; prior abn; repeat PP  CBB  No   CS/VBAC n/a   Support Person Husband          History of ectopic pregnancy 05/26/2019 by Gae Dry, MD No     PNV, PAP today  Breast feeding desired, counseled  Flu UTD from other offcie in Oct 2020  [place] sch for 7 weeks (anat)  The following were addressed during this visit:  Breastfeeding Education - Early initiation of  breastfeeding  - The importance of exclusive breastfeeding  - Risks of giving your baby anything other than breast milk if you are breastfeeding  - Nonpharmacological pain relief methods for labor  - The importance of early skin-to-skin contact  - Rooming-in on a 24-hour basis  - Feeding on demand or baby-led feeding  - Frequent feeding to help assure optimal milk production  - Effective positioning and attachment  - Exclusive breastfeeding for the first 6 months  - Individualized Education  - Early initiation of breastfeeding  - The importance of exclusive breastfeeding  - Risks of giving your baby anything other than breast milk if you are breastfeeding  - Nonpharmacological pain relief methods for labor  - The importance of early skin-to-skin contact  - Rooming-in on a 24-hour basis  - Feeding on demand or baby-led feeding  - Frequent feeding to help assure optimal milk production  - Effective positioning and attachment  - Exclusive breastfeeding for the first 6 months  - Individualized Education  10-12 weeks - Breastfeeding Education   Annamarie Major, MD, [facility], [facility] 01/10/2020  9:12 AM

## 2020-01-10 NOTE — Patient Instructions (Signed)
Genetic Testing During Pregnancy Genetic testing during pregnancy is also called prenatal genetic testing. This type of testing can determine if your baby is at risk of being born with a disorder caused by abnormal genes or chromosomes (genetic disorder). Chromosomes contain genes that control how your baby will develop in your womb. There are many different genetic disorders. Examples of genetic disorders that may be found through genetic testing include Down syndrome and cystic fibrosis. Gene changes (mutations) can be passed down through families. Genetic testing is offered to all women before or during pregnancy. You can choose whether to have genetic testing. Why is genetic testing done? Genetic testing is done during pregnancy to find out whether your child is at risk for a genetic disorder. Having genetic testing allows you to:  Discuss your test results and options with a genetic counselor.  Prepare for a baby that may be born with a genetic disorder. Learning about the disorder ahead of time helps you be better prepared to manage it. Your health care providers can also be prepared in case your baby requires special care before or after birth.  Consider whether you want to continue with the pregnancy. In some cases, genetic testing may be done to learn about the traits a child will inherit. Types of genetic tests There are two basic types of genetic testing. Screening tests indicate whether your developing baby (fetus) is at higher risk for a genetic disorder. Diagnostic tests check actual fetal cells to diagnose a genetic disorder. Screening tests     Screening tests will not harm your baby. They are recommended for all pregnant women. Types of screening tests include:  Carrier screening. This test involves checking genes from both parents by testing their blood or saliva. The test checks to find out if the parents carry a genetic mutation that may be passed to a baby. In most cases,  both parents must carry the mutation for a baby to be at risk.  First trimester screening. This test combines a blood test with sound wave imaging of your baby (fetal ultrasound). This screening test checks for a risk of Down syndrome or other defects caused by having extra chromosomes. It also checks for defects of the heart, abdomen, or skeleton.  Second trimester screening also combines a blood test with a fetal ultrasound exam. It checks for a risk of genetic defects of the face, brain, spine, heart, or limbs.  Combined or sequential screening. This type of testing combines the results of first and second trimester screening. This type of testing may be more accurate than first or second trimester screening alone.  Cell-free DNA testing. This is a blood test that detects cells released by the placenta that get into the mother's blood. It can be used to check for a risk of Down syndrome, other extra chromosome syndromes, and disorders caused by abnormal numbers of sex chromosomes. This test can be done any time after 10 weeks of pregnancy.  Diagnostic tests Diagnostic tests carry slight risks of problems, including bleeding, infection, and loss of the pregnancy. These tests are done only if your baby is at risk for a genetic disorder. You may meet with a genetic counselor to discuss the risks and benefits before having diagnostic tests. Examples of diagnostic tests include:  Chorionic villus sampling (CVS). This involves a procedure to remove and test a sample of cells taken from the placenta. The procedure may be done between 10 and 12 weeks of pregnancy.  Amniocentesis. This involves a   procedure to remove and test a sample of fluid (amniotic fluid) and cells from the sac that surrounds the developing baby. The procedure may be done between 15 and 20 weeks of pregnancy. What do the results mean? For a screening test:  If the results are negative, it often means that your child is not at higher  risk. There is still a slight chance your child could have a genetic disorder.  If the results are positive, it does not mean your child will have a genetic disorder. It may mean that your child has a higher-than-normal risk for a genetic disorder. In that case, you may want to talk with a genetic counselor about whether you should have diagnostic genetic tests. For a diagnostic test:  If the result is negative, it is unlikely that your child will have a genetic disorder.  If the test is positive for a genetic disorder, it is likely that your child will have the disorder. The test may not tell how severe the disorder will be. Talk with your health care provider about your options. Questions to ask your health care provider Before talking to your health care provider about genetic testing, find out if there is a history of genetic disorders in your family. It may also help to know your family's ethnic origins. Then ask your health care provider the following questions:  Is my baby at risk for a genetic disorder?  What are the benefits of having genetic screening?  What tests are best for me and my baby?  What are the risks of each test?  If I get a positive result on a screening test, what is the next step?  Should I meet with a genetic counselor before having a diagnostic test?  Should my partner or other members of my family be tested?  How much do the tests cost? Will my insurance cover the testing? Summary  Genetic testing is done during pregnancy to find out whether your child is at risk for a genetic disorder.  Genetic testing is offered to all women before or during pregnancy. You can choose whether to have genetic testing.  There are two basic types of genetic testing. Screening tests indicate whether your developing baby (fetus) is at higher risk for a genetic disorder. Diagnostic tests check actual fetal cells to diagnose a genetic disorder.  If a diagnostic genetic test is  positive, talk with your health care provider about your options. This information is not intended to replace advice given to you by your health care provider. Make sure you discuss any questions you have with your health care provider. Document Revised: 04/08/2019 Document Reviewed: 03/02/2018 Elsevier Patient Education  2020 [employer]vier Inc[employer]  

## 2020-01-11 LAB — CYTOLOGY - PAP: Diagnosis: NEGATIVE

## 2020-01-20 ENCOUNTER — Telehealth: Payer: Self-pay

## 2020-01-20 NOTE — Telephone Encounter (Signed)
Pt calling; has yellow d/c that comes and goes; no odor; no itching; 14wks.  [phone]  Adv d/c is normal throughout the preg as long as it doesn't itch, burn, irritate, is bloody or pink.  Adv as preg progresses d/c may get heavier as well.

## 2020-01-26 ENCOUNTER — Telehealth: Payer: Self-pay

## 2020-01-26 NOTE — Telephone Encounter (Signed)
Pt calling; is 15wks preg; has questions about vaginal itch that is off and on, no odor, d/c is less in amount.  [phone]  Pt is confused b/c it doesn't itch all the time.  Adv hormonal changes.  May try monistat 3d or 7d and if doesn't help to call and ask for 2/8 appt to be changed to face to face appt.

## 2020-01-27 ENCOUNTER — Telehealth: Payer: Self-pay

## 2020-01-27 NOTE — Telephone Encounter (Signed)
Pt calling; 15wks; had a little bit of blood in d/c; does she need to be seen?  [phone]  [facility]

## 2020-01-27 NOTE — Telephone Encounter (Signed)
Pt returned call; wonders if it could be that she scratched herself last night with the monistat applicator as she had a hard time with it.  No IC in the last 24hrs.  Adv that is possible.  To monitor it.  If increases to let us know.  If like a period definitely needs to be seen.  Pt reassured.

## 2020-02-07 ENCOUNTER — Ambulatory Visit (INDEPENDENT_AMBULATORY_CARE_PROVIDER_SITE_OTHER): Payer: Managed Care, Other (non HMO) | Admitting: Obstetrics & Gynecology

## 2020-02-07 ENCOUNTER — Other Ambulatory Visit: Payer: Self-pay

## 2020-02-07 ENCOUNTER — Encounter: Payer: Self-pay | Admitting: Obstetrics & Gynecology

## 2020-02-07 DIAGNOSIS — Z3A16 16 weeks gestation of pregnancy: Secondary | ICD-10-CM

## 2020-02-07 DIAGNOSIS — O99210 Obesity complicating pregnancy, unspecified trimester: Secondary | ICD-10-CM

## 2020-02-07 DIAGNOSIS — Z3480 Encounter for supervision of other normal pregnancy, unspecified trimester: Secondary | ICD-10-CM

## 2020-02-07 DIAGNOSIS — O99212 Obesity complicating pregnancy, second trimester: Secondary | ICD-10-CM

## 2020-02-07 DIAGNOSIS — Z3482 Encounter for supervision of other normal pregnancy, second trimester: Secondary | ICD-10-CM

## 2020-02-07 NOTE — Progress Notes (Signed)
Virtual Visit via Telephone Note  I connected with patient on 02/07/20 at  8:20 AM EST by telephone and verified that I am speaking with the correct person using two identifiers.   I discussed the limitations, risks, security and privacy concerns of performing an evaluation and management service by telephone and the availability of in person appointments. I also discussed with the patient that there may be a patient responsible charge related to this service. The patient expressed understanding and agreed to proceed.  The patient was at Home  I spoke with the patient from my  Office  Megan Newman is a 29 y.o. G2P0010 at [redacted]w[redacted]d being seen today for ongoing prenatal care.  She is currently monitored for the following issues for this low-risk pregnancy and has Ruptured right tubal ectopic pregnancy causing hemoperitoneum; History of ectopic pregnancy; Migraine; Anxiety; and Supervision of other normal pregnancy, antepartum on their problem list.  ----------------------------------------------------------------------------------- Patient reports no complaints.   Denies pain, VB, leaking of fluid.  ----------------------------------------------------------------------------------- The following portions of the patient's history were reviewed and updated as appropriate: allergies, current medications, past family history, past medical history, past social history, past surgical history and problem list. Problem list updated.   Objective  Last menstrual period 10/12/2019. Pregravid weight 195 lb (88.5 kg) Total Weight Gain -5 lb (-2.268 kg)  Physical Exam could not be performed. Because of the COVID-19 outbreak this visit was performed over the phone and not in person.   Assessment   29 y.o. G2P0010 at [redacted]w[redacted]d by  07/18/2020, by Last Menstrual Period presenting for routine prenatal visit  Plan   SECOND Problems (from 11/22/19 to present)    Problem Noted Resolved   Supervision of other  normal pregnancy, antepartum     Overview Addendum 01/10/2020  9:15 AM by Nadara Mustard, MD    Clinic [facility] Prenatal Labs  Dating [place] Blood type: AB/Positive/-- (11/23 1431)   Genetic Screen  NIPS: decline Antibody:Negative (11/23 1431)  Anatomic [place] nv Rubella: 13.80 (11/23 1431) Varicella:Immune  GTT   Third trimester:  RPR: Non Reactive (11/23 1431)   Rhogam n/a HBsAg: Negative (11/23 1431)   TDaP vaccine     Flu Shot:09/2019 HIV: Non Reactive (11/23 1431)   Baby Food  Breast GBS: p  Contraception   Unsure Pap: 1/21; abn in past, needs repeat pp  CBB   No   CS/VBAC  N/A   Support Person  Husband          History of ectopic pregnancy  No      Gestational age appropriate obstetric precautions including but not limited to vaginal bleeding, contractions, leaking of fluid and fetal movement were reviewed in detail with the patient.     Follow Up Instructions: Anat [place] nv 3 weeks as scheduled   I discussed the assessment and treatment plan with the patient. The patient was provided an opportunity to ask questions and all were answered. The patient agreed with the plan and demonstrated an understanding of the instructions.   The patient was advised to call back or seek an in-person evaluation if the symptoms worsen or if the condition fails to improve as anticipated.  I provided 8 minutes of non-face-to-face time during this encounter.  Return in about 4 weeks (around 03/06/2020) for Keep appt Mar1.  Annamarie Major, MD [facility], [facility] 02/07/2020 8:25 AM

## 2020-02-28 ENCOUNTER — Ambulatory Visit (INDEPENDENT_AMBULATORY_CARE_PROVIDER_SITE_OTHER): Payer: Managed Care, Other (non HMO) | Admitting: Obstetrics and Gynecology

## 2020-02-28 ENCOUNTER — Other Ambulatory Visit: Payer: Self-pay

## 2020-02-28 ENCOUNTER — Ambulatory Visit (INDEPENDENT_AMBULATORY_CARE_PROVIDER_SITE_OTHER): Payer: Managed Care, Other (non HMO)

## 2020-02-28 VITALS — BP 112/66 | Wt 196.0 lb

## 2020-02-28 DIAGNOSIS — Z3A19 19 weeks gestation of pregnancy: Secondary | ICD-10-CM | POA: Diagnosis not present

## 2020-02-28 DIAGNOSIS — Z363 Encounter for antenatal screening for malformations: Secondary | ICD-10-CM | POA: Diagnosis not present

## 2020-02-28 DIAGNOSIS — Z3689 Encounter for other specified antenatal screening: Secondary | ICD-10-CM | POA: Diagnosis not present

## 2020-02-28 DIAGNOSIS — O321XX0 Maternal care for breech presentation, not applicable or unspecified: Secondary | ICD-10-CM

## 2020-02-28 DIAGNOSIS — Z3482 Encounter for supervision of other normal pregnancy, second trimester: Secondary | ICD-10-CM

## 2020-02-28 DIAGNOSIS — Z3480 Encounter for supervision of other normal pregnancy, unspecified trimester: Secondary | ICD-10-CM

## 2020-02-28 NOTE — Progress Notes (Signed)
ROB  Anatomy Scan/ It is a girl!!

## 2020-02-28 NOTE — Progress Notes (Signed)
Routine Prenatal Care Visit  Subjective  Megan Newman is a 29 y.o. G2P0010 at [redacted]w[redacted]d being seen today for ongoing prenatal care.  She is currently monitored for the following issues for this low-risk pregnancy and has Ruptured right tubal ectopic pregnancy causing hemoperitoneum; History of ectopic pregnancy; Migraine; Anxiety; and Supervision of other normal pregnancy, antepartum on their problem list.  ----------------------------------------------------------------------------------- Patient reports no complaints.   Contractions: Not present. Vag. Bleeding: None.  Movement: Present. Denies leaking of fluid.  ----------------------------------------------------------------------------------- The following portions of the patient's history were reviewed and updated as appropriate: allergies, current medications, past family history, past medical history, past social history, past surgical history and problem list. Problem list updated.   Objective  Blood pressure 112/66, weight 196 lb (88.9 kg), last menstrual period 10/12/2019. Pregravid weight 195 lb (88.5 kg) Total Weight Gain 1 lb (0.454 kg)  Body mass index is 31.64 kg/m.  Urinalysis:      Fetal Status: Fetal Heart Rate (bpm): 150   Movement: Present     General:  Alert, oriented and cooperative. Patient is in no acute distress.  Skin: Skin is warm and dry. No rash noted.   Cardiovascular: Normal heart rate noted  Respiratory: Normal respiratory effort, no problems with respiration noted  Abdomen: Soft, gravid, appropriate for gestational age. Pain/Pressure: Absent     Pelvic:  Cervical exam deferred        Extremities: Normal range of motion.     ental Status: Normal Newman and affect. Normal behavior. Normal judgment and thought content.   US OB Comp + 14 Wk  Result Date: 02/28/2020 Patient Name: Megan Newman DOB: 26-May-1991 MRN: [id number] ULTRASOUND REPORT Location: [facility] Date of Service: 02/28/2020  Indications:Anatomy Ultrasound Findings: Megan Newman intrauterine pregnancy is visualized with FHR at 159 BPM. Biometrics give an (U/S) Gestational age of [redacted]w[redacted]d and an (U/S) EDD of 07/20/2020; this correlates with the clinically established Estimated Date of Delivery: 07/18/20 Fetal presentation is Breech. EFW: 312 g ( 11 oz ). Placenta: posterior. Grade: 1 AFI: subjectively normal. Anatomic survey is complete and normal; Gender - female.  Impression: 1. [redacted]w[redacted]d Viable Singleton Intrauterine pregnancy by U/S. 2. (U/S) EDD is consistent with Clinically established Estimated Date of Delivery: 07/18/20 . 3. Normal Anatomy Scan Recommendations: 1.Clinical correlation with the patient's History and Physical Exam. Megan Newman, RT  There is a singleton gestation with subjectively normal amniotic fluid volume. The fetal biometry correlates with established dating. Detailed evaluation of the fetal anatomy was performed.The fetal anatomical survey appears within normal limits within the resolution of ultrasound as described above.  It must be noted that a normal ultrasound is unable to rule out fetal aneuploidy, subtle defects such as small ASD or VDS may also not be visible on imaging.  Megan Mood, MD, [facility], [facility] 02/28/2020, 9:09 AM    Assessment   29 y.o. G2P0010 at [redacted]w[redacted]d by  07/18/2020, by Last Menstrual Period presenting for routine prenatal visit  Plan   SECOND Problems (from 11/22/19 to present)    Problem Noted Resolved   Supervision of other normal pregnancy, antepartum 11/23/2019 by Dalia Heading, CNM No   Overview Addendum 02/07/2020  8:27 AM by Gae Dry, MD    Clinic [facility] Prenatal Labs  Dating [place] Blood type: AB/Positive/-- (11/23 1431)   Genetic Screen  NIPS: declines Antibody:Negative (11/23 1431)  Anatomic [place] Normal Rubella: 13.80 (11/23 1431) Varicella:Immune  GTT  Third trimester:  RPR: Non  Reactive (11/23 1431)   Rhogam n/a HBsAg:  Negative (11/23 1431)   TDaP vaccine     Flu Shot:09/2019 HIV: Non Reactive (11/23 1431)   Baby Food  Breast GBS:   Contraception   Pap: 1/21; abn in past, needs repeat pp  CBB   No   CS/VBAC  N/A   Support Person  Husband          History of ectopic pregnancy 05/26/2019 by Nadara Mustard, MD No       Gestational age appropriate obstetric precautions including but not limited to vaginal bleeding, contractions, leaking of fluid and fetal movement were reviewed in detail with the patient.    Return in about 4 weeks (around 03/27/2020) for ROB.  Vena Austria, MD, Ever[facility], [facility] 02/28/2020, 9:28 AM

## 2020-03-27 ENCOUNTER — Ambulatory Visit (INDEPENDENT_AMBULATORY_CARE_PROVIDER_SITE_OTHER): Payer: 59 | Admitting: Obstetrics and Gynecology

## 2020-03-27 ENCOUNTER — Encounter: Payer: Self-pay | Admitting: Obstetrics and Gynecology

## 2020-03-27 ENCOUNTER — Other Ambulatory Visit: Payer: Self-pay

## 2020-03-27 VITALS — BP 114/68 | Wt 201.0 lb

## 2020-03-27 DIAGNOSIS — Z3402 Encounter for supervision of normal first pregnancy, second trimester: Secondary | ICD-10-CM

## 2020-03-27 DIAGNOSIS — Z3A23 23 weeks gestation of pregnancy: Secondary | ICD-10-CM

## 2020-03-27 NOTE — Patient Instructions (Signed)

## 2020-03-27 NOTE — Progress Notes (Signed)
ROB  No concerns Denies lof, no vb, Good FM 

## 2020-03-27 NOTE — Progress Notes (Signed)
    Routine Prenatal Care Visit  Subjective  Megan Newman is a 29 y.o. G2P0010 at [redacted]w[redacted]d being seen today for ongoing prenatal care.  She is currently monitored for the following issues for this low-risk pregnancy and has Ruptured right tubal ectopic pregnancy causing hemoperitoneum; History of ectopic pregnancy; Migraine; Anxiety; and Supervision of other normal pregnancy, antepartum on their problem list.  ----------------------------------------------------------------------------------- Patient reports no complaints.   Contractions: Not present. Vag. Bleeding: None.  Movement: Present. Denies leaking of fluid.  ----------------------------------------------------------------------------------- The following portions of the patient's history were reviewed and updated as appropriate: allergies, current medications, past family history, past medical history, past social history, past surgical history and problem list. Problem list updated.   Objective  Blood pressure 114/68, weight 201 lb (91.2 kg), last menstrual period 10/12/2019. Pregravid weight 195 lb (88.5 kg) Total Weight Gain 6 lb (2.722 kg) Urinalysis:      Fetal Status: Fetal Heart Rate (bpm): 156 Fundal Height: 24 cm Movement: Present     General:  Alert, oriented and cooperative. Patient is in no acute distress.  Skin: Skin is warm and dry. No rash noted.   Cardiovascular: Normal heart rate noted  Respiratory: Normal respiratory effort, no problems with respiration noted  Abdomen: Soft, gravid, appropriate for gestational age. Pain/Pressure: Absent     Pelvic:  Cervical exam deferred        Extremities: Normal range of motion.  Edema: None  Mental Status: Normal mood and affect. Normal behavior. Normal judgment and thought content.     Assessment   29 y.o. G2P0010 at [redacted]w[redacted]d by  07/18/2020, by Last Menstrual Period presenting for routine prenatal visit  Plan   SECOND Problems (from 11/22/19 to present)    Problem Noted Resolved   Supervision of other normal pregnancy, antepartum 11/23/2019 by Farrel Conners, CNM No   Overview Addendum 02/28/2020  9:28 AM by Vena Austria, MD    Clinic [facility] Prenatal Labs  Dating [place] Blood type: AB/Positive/-- (11/23 1431)   Genetic Screen  NIPS: declines Antibody:Negative (11/23 1431)  Anatomic [place] Normal female Rubella: 13.80 (11/23 1431) Varicella:Immune  GTT   Third trimester:  RPR: Non Reactive (11/23 1431)   Rhogam n/a HBsAg: Negative (11/23 1431)   TDaP vaccine     Flu Shot:09/2019 HIV: Non Reactive (11/23 1431)   Baby Food  Breast GBS:   Contraception   Pap: 1/21; abn in past, needs repeat pp  CBB   No   CS/VBAC  N/A   Support Person  Husband          History of ectopic pregnancy 05/26/2019 by Nadara Mustard, MD No       Gestational age appropriate obstetric precautions including but not limited to vaginal bleeding, contractions, leaking of fluid and fetal movement were reviewed in detail with the patient.    Return in about 4 weeks (around 04/24/2020) for ROB in person.  Natale Milch MD [facility], [facility] 03/27/2020, 8:57 AM

## 2020-04-24 ENCOUNTER — Other Ambulatory Visit: Payer: Self-pay

## 2020-04-24 ENCOUNTER — Encounter: Payer: Self-pay | Admitting: Obstetrics and Gynecology

## 2020-04-24 ENCOUNTER — Ambulatory Visit (INDEPENDENT_AMBULATORY_CARE_PROVIDER_SITE_OTHER): Payer: 59 | Admitting: Obstetrics and Gynecology

## 2020-04-24 VITALS — BP 118/74 | Wt 207.0 lb

## 2020-04-24 DIAGNOSIS — Z3480 Encounter for supervision of other normal pregnancy, unspecified trimester: Secondary | ICD-10-CM

## 2020-04-24 DIAGNOSIS — Z3483 Encounter for supervision of other normal pregnancy, third trimester: Secondary | ICD-10-CM

## 2020-04-24 DIAGNOSIS — Z3A27 27 weeks gestation of pregnancy: Secondary | ICD-10-CM

## 2020-04-24 NOTE — Progress Notes (Signed)
  Routine Prenatal Care Visit  Subjective  Megan Newman is a 29 y.o. G2P0010 at [redacted]w[redacted]d being seen today for ongoing prenatal care.  She is currently monitored for the following issues for this low-risk pregnancy and has Ruptured right tubal ectopic pregnancy causing hemoperitoneum; History of ectopic pregnancy; Migraine; Anxiety; and Supervision of other normal pregnancy, antepartum on their problem list.  ----------------------------------------------------------------------------------- Patient reports no complaints.   Contractions: Not present. Vag. Bleeding: None.  Movement: Present. Leaking Fluid denies.  ----------------------------------------------------------------------------------- The following portions of the patient's history were reviewed and updated as appropriate: allergies, current medications, past family history, past medical history, past social history, past surgical history and problem list. Problem list updated.  Objective  Blood pressure 118/74, weight 207 lb (93.9 kg), last menstrual period 10/12/2019. Pregravid weight 195 lb (88.5 kg) Total Weight Gain 12 lb (5.443 kg) Urinalysis: Urine Protein    Urine Glucose    Fetal Status: Fetal Heart Rate (bpm): 145 Fundal Height: 28 cm Movement: Present     General:  Alert, oriented and cooperative. Patient is in no acute distress.  Skin: Skin is warm and dry. No rash noted.   Cardiovascular: Normal heart rate noted  Respiratory: Normal respiratory effort, no problems with respiration noted  Abdomen: Soft, gravid, appropriate for gestational age. Pain/Pressure: Absent     Pelvic:  Cervical exam deferred        Extremities: Normal range of motion.  Edema: None  Mental Status: Normal mood and affect. Normal behavior. Normal judgment and thought content.   Assessment   29 y.o. G2P0010 at [redacted]w[redacted]d by  07/18/2020, by Last Menstrual Period presenting for routine prenatal visit  Plan   SECOND Problems (from 11/22/19 to  present)    Problem Noted Resolved   Supervision of other normal pregnancy, antepartum 11/23/2019 by Farrel Conners, CNM No   Overview Addendum 02/28/2020  9:28 AM by Vena Austria, MD    Clinic [facility] Prenatal Labs  Dating [place] Blood type: AB/Positive/-- (11/23 1431)   Genetic Screen  NIPS: declines Antibody:Negative (11/23 1431)  Anatomic [place] Normal female Rubella: 13.80 (11/23 1431) Varicella:Immune  GTT   Third trimester:  RPR: Non Reactive (11/23 1431)   Rhogam n/a HBsAg: Negative (11/23 1431)   TDaP vaccine     Flu Shot:09/2019 HIV: Non Reactive (11/23 1431)   Baby Food  Breast GBS:   Contraception   Pap: 1/21; abn in past, needs repeat pp  CBB   No   CS/VBAC  N/A   Support Person  Husband          History of ectopic pregnancy 05/26/2019 by Nadara Mustard, MD No       Preterm labor symptoms and general obstetric precautions including but not limited to vaginal bleeding, contractions, leaking of fluid and fetal movement were reviewed in detail with the patient. Please refer to After Visit Summary for other counseling recommendations.   - 28 week labs not schedule today for some reason. Will schedule separately for this week.  Return in about 2 weeks (around 05/08/2020) for 2 weeks -Routine Prenatal Appointment (schedule 28 week labs this week, no provider appt).  Thomasene Mohair, MD, [facility], [facility] 04/24/2020 8:39 AM

## 2020-04-27 ENCOUNTER — Other Ambulatory Visit: Payer: Self-pay

## 2020-04-27 ENCOUNTER — Other Ambulatory Visit: Payer: 59

## 2020-04-27 DIAGNOSIS — Z3402 Encounter for supervision of normal first pregnancy, second trimester: Secondary | ICD-10-CM

## 2020-04-28 LAB — 28 WEEK RH+PANEL
Basophils Absolute: 0 10*3/uL (ref 0.0–0.2)
Basos: 0 %
EOS (ABSOLUTE): 0.3 10*3/uL (ref 0.0–0.4)
Eos: 3 %
Gestational Diabetes Screen: 106 mg/dL (ref 65–139)
HIV Screen 4th Generation wRfx: NONREACTIVE
Hematocrit: 36 % (ref 34.0–46.6)
Hemoglobin: 11.9 g/dL (ref 11.1–15.9)
Immature Grans (Abs): 0.1 10*3/uL (ref 0.0–0.1)
Immature Granulocytes: 1 %
Lymphocytes Absolute: 1.7 10*3/uL (ref 0.7–3.1)
Lymphs: 17 %
MCH: 30.3 pg (ref 26.6–33.0)
MCHC: 33.1 g/dL (ref 31.5–35.7)
MCV: 92 fL (ref 79–97)
Monocytes Absolute: 0.6 10*3/uL (ref 0.1–0.9)
Monocytes: 6 %
Neutrophils Absolute: 7.2 10*3/uL — ABNORMAL HIGH (ref 1.4–7.0)
Neutrophils: 73 %
Platelets: 256 10*3/uL (ref 150–450)
RBC: 3.93 x10E6/uL (ref 3.77–5.28)
RDW: 12.7 % (ref 11.7–15.4)
RPR Ser Ql: NONREACTIVE
WBC: 9.8 10*3/uL (ref 3.4–10.8)

## 2020-05-08 ENCOUNTER — Ambulatory Visit (INDEPENDENT_AMBULATORY_CARE_PROVIDER_SITE_OTHER): Payer: Managed Care, Other (non HMO) | Admitting: Obstetrics and Gynecology

## 2020-05-08 ENCOUNTER — Encounter: Payer: Self-pay | Admitting: Obstetrics and Gynecology

## 2020-05-08 ENCOUNTER — Other Ambulatory Visit: Payer: Self-pay

## 2020-05-08 VITALS — BP 122/76 | Wt 209.0 lb

## 2020-05-08 DIAGNOSIS — Z3483 Encounter for supervision of other normal pregnancy, third trimester: Secondary | ICD-10-CM

## 2020-05-08 DIAGNOSIS — Z3A29 29 weeks gestation of pregnancy: Secondary | ICD-10-CM

## 2020-05-08 NOTE — Progress Notes (Signed)
  Routine Prenatal Care Visit  Subjective  Megan Newman is a 29 y.o. G2P0010 at [redacted]w[redacted]d being seen today for ongoing prenatal care.  She is currently monitored for the following issues for this low-risk pregnancy and has Ruptured right tubal ectopic pregnancy causing hemoperitoneum; History of ectopic pregnancy; Migraine; Anxiety; and Supervision of other normal pregnancy, antepartum on their problem list.  ----------------------------------------------------------------------------------- Patient reports no complaints.   Contractions: Not present. Vag. Bleeding: None.  Movement: Present. Leaking Fluid denies.  ----------------------------------------------------------------------------------- The following portions of the patient's history were reviewed and updated as appropriate: allergies, current medications, past family history, past medical history, past social history, past surgical history and problem list. Problem list updated.  Objective  Blood pressure 122/76, weight 209 lb (94.8 kg), last menstrual period 10/12/2019. Pregravid weight 195 lb (88.5 kg) Total Weight Gain 14 lb (6.35 kg) Urinalysis: Urine Protein    Urine Glucose    Fetal Status: Fetal Heart Rate (bpm): 140 Fundal Height: 30 cm Movement: Present     General:  Alert, oriented and cooperative. Patient is in no acute distress.  Skin: Skin is warm and dry. No rash noted.   Cardiovascular: Normal heart rate noted  Respiratory: Normal respiratory effort, no problems with respiration noted  Abdomen: Soft, gravid, appropriate for gestational age. Pain/Pressure: Absent     Pelvic:  Cervical exam deferred        Extremities: Normal range of motion.  Edema: None  Mental Status: Normal mood and affect. Normal behavior. Normal judgment and thought content.   Assessment   29 y.o. G2P0010 at [redacted]w[redacted]d by  07/18/2020, by Last Menstrual Period presenting for routine prenatal visit  Plan   SECOND Problems (from 11/22/19 to  present)    Problem Noted Resolved   Supervision of other normal pregnancy, antepartum 11/23/2019 by Farrel Conners, CNM No   Overview Addendum 02/28/2020  9:28 AM by Vena Austria, MD    Clinic [facility] Prenatal Labs  Dating [place] Blood type: AB/Positive/-- (11/23 1431)   Genetic Screen  NIPS: declines Antibody:Negative (11/23 1431)  Anatomic [place] Normal female Rubella: 13.80 (11/23 1431) Varicella:Immune  GTT   Third trimester:  RPR: Non Reactive (11/23 1431)   Rhogam n/a HBsAg: Negative (11/23 1431)   TDaP vaccine     Flu Shot:09/2019 HIV: Non Reactive (11/23 1431)   Baby Food  Breast GBS:   Contraception   Pap: 1/21; abn in past, needs repeat pp  CBB   No   CS/VBAC  N/A   Support Person  Husband          History of ectopic pregnancy 05/26/2019 by Nadara Mustard, MD No       Preterm labor symptoms and general obstetric precautions including but not limited to vaginal bleeding, contractions, leaking of fluid and fetal movement were reviewed in detail with the patient. Please refer to After Visit Summary for other counseling recommendations.   Return in about 2 weeks (around 05/22/2020) for Routine Prenatal Appointment.  Thomasene Mohair, MD, [facility], [facility] 05/08/2020 8:33 AM

## 2020-05-22 ENCOUNTER — Encounter: Payer: Self-pay | Admitting: Obstetrics and Gynecology

## 2020-05-22 ENCOUNTER — Ambulatory Visit (INDEPENDENT_AMBULATORY_CARE_PROVIDER_SITE_OTHER): Payer: Managed Care, Other (non HMO) | Admitting: Obstetrics and Gynecology

## 2020-05-22 ENCOUNTER — Other Ambulatory Visit: Payer: Self-pay

## 2020-05-22 VITALS — BP 104/64 | Wt 213.0 lb

## 2020-05-22 DIAGNOSIS — Z23 Encounter for immunization: Secondary | ICD-10-CM

## 2020-05-22 DIAGNOSIS — Z3A31 31 weeks gestation of pregnancy: Secondary | ICD-10-CM

## 2020-05-22 DIAGNOSIS — Z3480 Encounter for supervision of other normal pregnancy, unspecified trimester: Secondary | ICD-10-CM

## 2020-05-22 DIAGNOSIS — Z3483 Encounter for supervision of other normal pregnancy, third trimester: Secondary | ICD-10-CM

## 2020-05-22 NOTE — Progress Notes (Signed)
    Routine Prenatal Care Visit  Subjective  Megan Newman is a 29 y.o. G2P0010 at [redacted]w[redacted]d being seen today for ongoing prenatal care.  She is currently monitored for the following issues for this low-risk pregnancy and has Ruptured right tubal ectopic pregnancy causing hemoperitoneum; History of ectopic pregnancy; Migraine; Anxiety; Supervision of other normal pregnancy, antepartum; History of abnormal cervical Pap smear; Irregular menstrual bleeding; and Pelvic pain in female on their problem list.  ----------------------------------------------------------------------------------- Patient reports no complaints.   Contractions: Not present. Vag. Bleeding: None.  Movement: Present. Denies leaking of fluid.  ----------------------------------------------------------------------------------- The following portions of the patient's history were reviewed and updated as appropriate: allergies, current medications, past family history, past medical history, past social history, past surgical history and problem list. Problem list updated.   Objective  Last menstrual period 10/12/2019. Pregravid weight 195 lb (88.5 kg) Total Weight Gain 18 lb (8.165 kg) Urinalysis:      Fetal Status: Fetal Heart Rate (bpm): 135 Fundal Height: 33 cm Movement: Present     General:  Alert, oriented and cooperative. Patient is in no acute distress.  Skin: Skin is warm and dry. No rash noted.   Cardiovascular: Normal heart rate noted  Respiratory: Normal respiratory effort, no problems with respiration noted  Abdomen: Soft, gravid, appropriate for gestational age. Pain/Pressure: Absent     Pelvic:  Cervical exam deferred        Extremities: Normal range of motion.  Edema: None  Mental Status: Normal mood and affect. Normal behavior. Normal judgment and thought content.     Assessment   28 y.o. G2P0010 at [redacted]w[redacted]d by  07/18/2020, by Last Menstrual Period presenting for routine prenatal visit  Plan   SECOND  Problems (from 11/22/19 to present)    Problem Noted Resolved   Supervision of other normal pregnancy, antepartum 11/23/2019 by Farrel Conners, CNM No   Overview Addendum 05/22/2020  8:22 AM by Natale Milch, MD    Clinic [facility] Prenatal Labs  Dating [place] Blood type: AB/Positive/-- (11/23 1431)   Genetic Screen  NIPS: declines Antibody:Negative (11/23 1431)  Anatomic [place] Normal female Rubella: 13.80 (11/23 1431) Varicella:Immune  GTT   Third trimester: 106 RPR: Non Reactive (11/23 1431)   Rhogam n/a HBsAg: Negative (11/23 1431)   TDaP vaccine   05/22/2020   Flu Shot:09/2019 HIV: Non Reactive (11/23 1431)   Baby Food  Breast GBS:   Contraception   Pap: 1/21; abn in past, needs repeat pp  CBB   No   CS/VBAC  N/A   Support Person  Husband          History of ectopic pregnancy 05/26/2019 by Nadara Mustard, MD No     TDAP today Discussed COVID vaccination   Gestational age appropriate obstetric precautions including but not limited to vaginal bleeding, contractions, leaking of fluid and fetal movement were reviewed in detail with the patient.    Return in about 2 weeks (around 06/05/2020) for ROB in person.  Natale Milch MD [facility], [facility] 05/22/2020, 8:35 AM

## 2020-05-22 NOTE — Patient Instructions (Addendum)
Third Trimester of Pregnancy The third trimester is from week 28 through week 40 (months 7 through 9). The third trimester is a time when the unborn baby (fetus) is growing rapidly. At the end of the ninth month, the fetus is about 20 inches in length and weighs 6-10 pounds. Body changes during your third trimester Your body will continue to go through many changes during pregnancy. The changes vary from woman to woman. During the third trimester:  Your weight will continue to increase. You can expect to gain 25-35 pounds (11-16 kg) by the end of the pregnancy.  You may begin to get stretch marks on your hips, abdomen, and breasts.  You may urinate more often because the fetus is moving lower into your pelvis and pressing on your bladder.  You may develop or continue to have heartburn. This is caused by increased hormones that slow down muscles in the digestive tract.  You may develop or continue to have constipation because increased hormones slow digestion and cause the muscles that push waste through your intestines to relax.  You may develop hemorrhoids. These are swollen veins (varicose veins) in the rectum that can itch or be painful.  You may develop swollen, bulging veins (varicose veins) in your legs.  You may have increased body aches in the pelvis, back, or thighs. This is due to weight gain and increased hormones that are relaxing your joints.  You may have changes in your hair. These can include thickening of your hair, rapid growth, and changes in texture. Some women also have hair loss during or after pregnancy, or hair that feels dry or thin. Your hair will most likely return to normal after your baby is born.  Your breasts will continue to grow and they will continue to become tender. A yellow fluid (colostrum) may leak from your breasts. This is the first milk you are producing for your baby.  Your belly button may stick out.  You may notice more swelling in your hands,  face, or ankles.  You may have increased tingling or numbness in your hands, arms, and legs. The skin on your belly may also feel numb.  You may feel short of breath because of your expanding uterus.  You may have more problems sleeping. This can be caused by the size of your belly, increased need to urinate, and an increase in your body's metabolism.  You may notice the fetus "dropping," or moving lower in your abdomen (lightening).  You may have increased vaginal discharge.  You may notice your joints feel loose and you may have pain around your pelvic bone. What to expect at prenatal visits You will have prenatal exams every 2 weeks until week 36. Then you will have weekly prenatal exams. During a routine prenatal visit:  You will be weighed to make sure you and the baby are growing normally.  Your blood pressure will be taken.  Your abdomen will be measured to track your baby's growth.  The fetal heartbeat will be listened to.  Any test results from the previous visit will be discussed.  You may have a cervical check near your due date to see if your cervix has softened or thinned (effaced).  You will be tested for Group B streptococcus. This happens between 35 and 37 weeks. Your health care provider may ask you:  What your birth plan is.  How you are feeling.  If you are feeling the baby move.  If you have had any abnormal   symptoms, such as leaking fluid, bleeding, severe headaches, or abdominal cramping.  If you are using any tobacco products, including cigarettes, chewing tobacco, and electronic cigarettes.  If you have any questions. Other tests or screenings that may be performed during your third trimester include:  Blood tests that check for low iron levels (anemia).  Fetal testing to check the health, activity level, and growth of the fetus. Testing is done if you have certain medical conditions or if there are problems during the pregnancy.  Nonstress test  (NST). This test checks the health of your baby to make sure there are no signs of problems, such as the baby not getting enough oxygen. During this test, a belt is placed around your belly. The baby is made to move, and its heart rate is monitored during movement. What is false labor? False labor is a condition in which you feel small, irregular tightenings of the muscles in the womb (contractions) that usually go away with rest, changing position, or drinking water. These are called Braxton Hicks contractions. Contractions may last for hours, days, or even weeks before true labor sets in. If contractions come at regular intervals, become more frequent, increase in intensity, or become painful, you should see your health care provider. What are the signs of labor?  Abdominal cramps.  Regular contractions that start at 10 minutes apart and become stronger and more frequent with time.  Contractions that start on the top of the uterus and spread down to the lower abdomen and back.  Increased pelvic pressure and dull back pain.  A watery or bloody mucus discharge that comes from the vagina.  Leaking of amniotic fluid. This is also known as your "water breaking." It could be a slow trickle or a gush. Let your health care provider know if it has a color or strange odor. If you have any of these signs, call your health care provider right away, even if it is before your due date. Follow these instructions at home: Medicines  Follow your health care provider's instructions regarding medicine use. Specific medicines may be either safe or unsafe to take during pregnancy.  Take a prenatal vitamin that contains at least 600 micrograms (mcg) of folic acid.  If you develop constipation, try taking a stool softener if your health care provider approves. Eating and drinking   Eat a balanced diet that includes fresh fruits and vegetables, whole grains, good sources of protein such as meat, eggs, or tofu,  and low-fat dairy. Your health care provider will help you determine the amount of weight gain that is right for you.  Avoid raw meat and uncooked cheese. These carry germs that can cause birth defects in the baby.  If you have low calcium intake from food, talk to your health care provider about whether you should take a daily calcium supplement.  Eat four or five small meals rather than three large meals a day.  Limit foods that are high in fat and processed sugars, such as fried and sweet foods.  To prevent constipation: ? Drink enough fluid to keep your urine clear or pale yellow. ? Eat foods that are high in fiber, such as fresh fruits and vegetables, whole grains, and beans. Activity  Exercise only as directed by your health care provider. Most women can continue their usual exercise routine during pregnancy. Try to exercise for 30 minutes at least 5 days a week. Stop exercising if you experience uterine contractions.  Avoid heavy lifting.  Do   not exercise in extreme heat or humidity, or at high altitudes.  Wear low-heel, comfortable shoes.  Practice good posture.  You may continue to have sex unless your health care provider tells you otherwise. Relieving pain and discomfort  Take frequent breaks and rest with your legs elevated if you have leg cramps or low back pain.  Take warm sitz baths to soothe any pain or discomfort caused by hemorrhoids. Use hemorrhoid cream if your health care provider approves.  Wear a good support bra to prevent discomfort from breast tenderness.  If you develop varicose veins: ? Wear support pantyhose or compression stockings as told by your healthcare provider. ? Elevate your feet for 15 minutes, 3-4 times a day. Prenatal care  Write down your questions. Take them to your prenatal visits.  Keep all your prenatal visits as told by your health care provider. This is important. Safety  Wear your seat belt at all times when driving.  Make  a list of emergency phone numbers, including numbers for family, friends, the hospital, and police and fire departments. General instructions  Avoid cat litter boxes and soil used by cats. These carry germs that can cause birth defects in the baby. If you have a cat, ask someone to clean the litter box for you.  Do not travel far distances unless it is absolutely necessary and only with the approval of your health care provider.  Do not use hot tubs, steam rooms, or saunas.  Do not drink alcohol.  Do not use any products that contain nicotine or tobacco, such as cigarettes and e-cigarettes. If you need help quitting, ask your health care provider.  Do not use any medicinal herbs or unprescribed drugs. These chemicals affect the formation and growth of the baby.  Do not douche or use tampons or scented sanitary pads.  Do not cross your legs for long periods of time.  To prepare for the arrival of your baby: ? Take prenatal classes to understand, practice, and ask questions about labor and delivery. ? Make a trial run to the hospital. ? Visit the hospital and tour the maternity area. ? Arrange for maternity or paternity leave through employers. ? Arrange for family and friends to take care of pets while you are in the hospital. ? Purchase a rear-facing car seat and make sure you know how to install it in your car. ? Pack your hospital bag. ? Prepare the baby's nursery. Make sure to remove all pillows and stuffed animals from the baby's crib to prevent suffocation.  Visit your dentist if you have not gone during your pregnancy. Use a soft toothbrush to brush your teeth and be gentle when you floss. Contact a health care provider if:  You are unsure if you are in labor or if your water has broken.  You become dizzy.  You have mild pelvic cramps, pelvic pressure, or nagging pain in your abdominal area.  You have lower back pain.  You have persistent nausea, vomiting, or  diarrhea.  You have an unusual or bad smelling vaginal discharge.  You have pain when you urinate. Get help right away if:  Your water breaks before 37 weeks.  You have regular contractions less than 5 minutes apart before 37 weeks.  You have a fever.  You are leaking fluid from your vagina.  You have spotting or bleeding from your vagina.  You have severe abdominal pain or cramping.  You have rapid weight loss or weight gain.  You have   shortness of breath with chest pain.  You notice sudden or extreme swelling of your face, hands, ankles, feet, or legs.  Your baby makes fewer than 10 movements in 2 hours.  You have severe headaches that do not go away when you take medicine.  You have vision changes. Summary  The third trimester is from week 28 through week 40, months 7 through 9. The third trimester is a time when the unborn baby (fetus) is growing rapidly.  During the third trimester, your discomfort may increase as you and your baby continue to gain weight. You may have abdominal, leg, and back pain, sleeping problems, and an increased need to urinate.  During the third trimester your breasts will keep growing and they will continue to become tender. A yellow fluid (colostrum) may leak from your breasts. This is the first milk you are producing for your baby.  False labor is a condition in which you feel small, irregular tightenings of the muscles in the womb (contractions) that eventually go away. These are called Braxton Hicks contractions. Contractions may last for hours, days, or even weeks before true labor sets in.  Signs of labor can include: abdominal cramps; regular contractions that start at 10 minutes apart and become stronger and more frequent with time; watery or bloody mucus discharge that comes from the vagina; increased pelvic pressure and dull back pain; and leaking of amniotic fluid. This information is not intended to replace advice given to you by your  health care provider. Make sure you discuss any questions you have with your health care provider. Document Revised: 04/08/2019 Document Reviewed: 01/21/2017 Elsevier Patient Education  2020 [employer] Inc[employer]             https://www.cdc.gov/vaccines/hcp/vis/vis-statements/tdap.pdf">  Tdap (Tetanus, Diphtheria, Pertussis) Vaccine: What You Need to Know 1. Why get vaccinated? Tdap vaccine can prevent tetanus, diphtheria, and pertussis. Diphtheria and pertussis spread from person to person. Tetanus enters the body through cuts or wounds.  TETANUS (T) causes painful stiffening of the muscles. Tetanus can lead to serious health problems, including being unable to open the mouth, having trouble swallowing and breathing, or death.  DIPHTHERIA (D) can lead to difficulty breathing, heart failure, paralysis, or death.  PERTUSSIS (aP), also known as "whooping cough," can cause uncontrollable, violent coughing which makes it hard to breathe, eat, or drink. Pertussis can be extremely serious in babies and young children, causing pneumonia, convulsions, brain damage, or death. In teens and adults, it can cause weight loss, loss of bladder control, passing out, and rib fractures from severe coughing. 2. Tdap vaccine Tdap is only for children 7 years and older, adolescents, and adults.  Adolescents should receive a single dose of Tdap, preferably at age 11 or 12 years. Pregnant women should get a dose of Tdap during every pregnancy, to protect the newborn from pertussis. Infants are most at risk for severe, life-threatening complications from pertussis. Adults who have never received Tdap should get a dose of Tdap. Also, adults should receive a booster dose every 10 years, or earlier in the case of a severe and dirty wound or burn. Booster doses can be either Tdap or Td (a different vaccine that protects against tetanus and diphtheria but not pertussis). Tdap may be given at the same time as other  vaccines. 3. Talk with your health care provider Tell your vaccine provider if the person getting the vaccine:  Has had an allergic reaction after a previous dose of any vaccine that protects against tetanus,   diphtheria, or pertussis, or has any severe, life-threatening allergies.  Has had a coma, decreased level of consciousness, or prolonged seizures within 7 days after a previous dose of any pertussis vaccine (DTP, DTaP, or Tdap).  Has seizures or another nervous system problem.  Has ever had Guillain-Barr Syndrome (also called GBS).  Has had severe pain or swelling after a previous dose of any vaccine that protects against tetanus or diphtheria. In some cases, your health care provider may decide to postpone Tdap vaccination to a future visit.  People with minor illnesses, such as a cold, may be vaccinated. People who are moderately or severely ill should usually wait until they recover before getting Tdap vaccine.  Your health care provider can give you more information. 4. Risks of a vaccine reaction  Pain, redness, or swelling where the shot was given, mild fever, headache, feeling tired, and nausea, vomiting, diarrhea, or stomachache sometimes happen after Tdap vaccine. People sometimes faint after medical procedures, including vaccination. Tell your provider if you feel dizzy or have vision changes or ringing in the ears.  As with any medicine, there is a very remote chance of a vaccine causing a severe allergic reaction, other serious injury, or death. 5. What if there is a serious problem? An allergic reaction could occur after the vaccinated person leaves the clinic. If you see signs of a severe allergic reaction (hives, swelling of the face and throat, difficulty breathing, a fast heartbeat, dizziness, or weakness), call 9-1-1 and get the person to the nearest hospital. For other signs that concern you, call your health care provider.  Adverse reactions should be reported to the  Vaccine Adverse Event Reporting System (VAERS). Your health care provider will usually file this report, or you can do it yourself. Visit the VAERS website at www.vaers.hhs.gov or call 1-800-822-7967. VAERS is only for reporting reactions, and VAERS staff do not give medical advice. 6. The National Vaccine Injury Compensation Program The National Vaccine Injury Compensation Program (VICP) is a federal program that was created to compensate people who may have been injured by certain vaccines. Visit the VICP website at www.hrsa.gov/vaccinecompensation or call 1-800-338-2382 to learn about the program and about filing a claim. There is a time limit to file a claim for compensation. 7. How can I learn more?  Ask your health care provider.  Call your local or state health department.  Contact the Centers for Disease Control and Prevention (CDC): ? Call 1-800-232-4636 (1-800-CDC-INFO) or ? Visit CDC's website at www.cdc.gov/vaccines Vaccine Information Statement Tdap (Tetanus, Diphtheria, Pertussis) Vaccine (03/31/2019) This information is not intended to replace advice given to you by your health care provider. Make sure you discuss any questions you have with your health care provider. Document Revised: 04/09/2019 Document Reviewed: 04/12/2019 Elsevier Patient Education  2020 Elsevier Inc.  

## 2020-06-05 ENCOUNTER — Ambulatory Visit (INDEPENDENT_AMBULATORY_CARE_PROVIDER_SITE_OTHER): Payer: Managed Care, Other (non HMO) | Admitting: Obstetrics & Gynecology

## 2020-06-05 ENCOUNTER — Encounter: Payer: Self-pay | Admitting: Obstetrics & Gynecology

## 2020-06-05 ENCOUNTER — Other Ambulatory Visit: Payer: Self-pay

## 2020-06-05 VITALS — BP 100/60 | HR 104 | Wt 215.0 lb

## 2020-06-05 DIAGNOSIS — Z3A33 33 weeks gestation of pregnancy: Secondary | ICD-10-CM

## 2020-06-05 DIAGNOSIS — Z3483 Encounter for supervision of other normal pregnancy, third trimester: Secondary | ICD-10-CM

## 2020-06-05 LAB — POCT URINALYSIS DIPSTICK OB
Glucose, UA: NEGATIVE
POC,PROTEIN,UA: NEGATIVE

## 2020-06-05 NOTE — Addendum Note (Signed)
Addended by: Cornelius Moras D on: 06/05/2020 08:48 AM   Modules accepted: Orders

## 2020-06-05 NOTE — Progress Notes (Signed)
  Subjective  Fetal Movement? yes Contractions? no Leaking Fluid? no Vaginal Bleeding? no One episode of BHs, but rare Edema only when works Objective  BP 100/60   Pulse (!) 104   Wt 215 lb (97.5 kg)   LMP 10/12/2019 (Exact Date)   BMI 34.70 kg/m  General: NAD Pumonary: no increased work of breathing Abdomen: gravid, non-tender Extremities: no edema Psychiatric: mood appropriate, affect full  Assessment  29 y.o. G2P0010 at [redacted]w[redacted]d by  07/18/2020, by Last Menstrual Period presenting for routine prenatal visit  Plan   Problem List Items Addressed This Visit      Other   Supervision of other normal pregnancy, antepartum    Other Visit Diagnoses    [redacted] weeks gestation of pregnancy    -  Primary      SECOND Problems (from 11/22/19 to present)    Problem Noted Resolved   Supervision of other normal pregnancy, antepartum 11/23/2019 by Farrel Conners, CNM No   Overview Addendum 05/22/2020  8:35 AM by Natale Milch, MD    [facility] Prenatal Labs  Dating LMP= 8US Blood type: AB/Positive/-- (11/23 1431)   Genetic Screen  NIPS: declines Antibody:Negative (11/23 1431)  Anatomic [place] Normal female Rubella: 13.80 (11/23 1431) Varicella:Immune  GTT   Third trimester: 106 RPR: Non Reactive (11/23 1431)   Rhogam n/a HBsAg: Negative (11/23 1431)   TDaP vaccine   05/22/2020   Flu Shot:09/2019 HIV: Non Reactive (11/23 1431)   Baby Food  Breast GBS: nv  Contraception  Unsure Pap: 1/21; abn in past, needs repeat pp  CBB   No   CS/VBAC  N/A   Support Person  Husband          Annamarie Major, MD, [facility], [facility] 06/05/2020  8:40 AM

## 2020-06-20 ENCOUNTER — Ambulatory Visit (INDEPENDENT_AMBULATORY_CARE_PROVIDER_SITE_OTHER): Payer: Managed Care, Other (non HMO) | Admitting: Certified Nurse Midwife

## 2020-06-20 ENCOUNTER — Other Ambulatory Visit: Payer: Self-pay

## 2020-06-20 VITALS — BP 112/70 | Ht 66.0 in | Wt 217.2 lb

## 2020-06-20 DIAGNOSIS — Z3A36 36 weeks gestation of pregnancy: Secondary | ICD-10-CM

## 2020-06-20 DIAGNOSIS — E669 Obesity, unspecified: Secondary | ICD-10-CM

## 2020-06-20 DIAGNOSIS — O99210 Obesity complicating pregnancy, unspecified trimester: Secondary | ICD-10-CM

## 2020-06-20 DIAGNOSIS — Z3483 Encounter for supervision of other normal pregnancy, third trimester: Secondary | ICD-10-CM

## 2020-06-20 DIAGNOSIS — Z3685 Encounter for antenatal screening for Streptococcus B: Secondary | ICD-10-CM

## 2020-06-20 DIAGNOSIS — Z3689 Encounter for other specified antenatal screening: Secondary | ICD-10-CM

## 2020-06-20 LAB — FETAL NONSTRESS TEST

## 2020-06-20 LAB — POCT URINALYSIS DIPSTICK OB
Glucose, UA: NEGATIVE
POC,PROTEIN,UA: NEGATIVE

## 2020-06-24 LAB — CULTURE, BETA STREP (GROUP B ONLY): Strep Gp B Culture: NEGATIVE

## 2020-06-25 DIAGNOSIS — E669 Obesity, unspecified: Secondary | ICD-10-CM | POA: Insufficient documentation

## 2020-06-25 DIAGNOSIS — O99210 Obesity complicating pregnancy, unspecified trimester: Secondary | ICD-10-CM | POA: Insufficient documentation

## 2020-06-25 NOTE — Progress Notes (Signed)
ROB at 36 weeks: BMI 35 kg/m2. Good fetal movement irregular contractions. No vaginal bleeding or leakage of fluid.  FH 36 cm. BP 112/70  NST: baseline 145 with accelerations to 160s to 170s, moderate variability  A: IUP at 36 weeks S=D Reactive NST  P: NST/ROB in 1 week GBS today Labor precautions  Farrel Conners, CNM

## 2020-06-27 ENCOUNTER — Encounter: Payer: Self-pay | Admitting: Advanced Practice Midwife

## 2020-06-27 ENCOUNTER — Other Ambulatory Visit: Payer: Self-pay

## 2020-06-27 ENCOUNTER — Ambulatory Visit (INDEPENDENT_AMBULATORY_CARE_PROVIDER_SITE_OTHER): Payer: Managed Care, Other (non HMO) | Admitting: Advanced Practice Midwife

## 2020-06-27 VITALS — BP 122/74 | Wt 219.0 lb

## 2020-06-27 DIAGNOSIS — E669 Obesity, unspecified: Secondary | ICD-10-CM

## 2020-06-27 DIAGNOSIS — Z3A37 37 weeks gestation of pregnancy: Secondary | ICD-10-CM | POA: Diagnosis not present

## 2020-06-27 DIAGNOSIS — Z3480 Encounter for supervision of other normal pregnancy, unspecified trimester: Secondary | ICD-10-CM | POA: Diagnosis not present

## 2020-06-27 NOTE — Progress Notes (Signed)
  Routine Prenatal Care Visit  Subjective  Megan Newman is a 29 y.o. G2P0010 at [redacted]w[redacted]d being seen today for ongoing prenatal care.  She is currently monitored for the following issues for this low-risk pregnancy and has Ruptured right tubal ectopic pregnancy causing hemoperitoneum; History of ectopic pregnancy; Migraine; Anxiety; Supervision of other normal pregnancy, antepartum; History of abnormal cervical Pap smear; Irregular menstrual bleeding; Pelvic pain in female; Obesity in pregnancy; and Obesity (BMI 35.0-39.9 without comorbidity) on their problem list.  ----------------------------------------------------------------------------------- Patient reports no complaints.   Contractions: Irregular. Vag. Bleeding: None.  Movement: Present. Leaking Fluid denies.  ----------------------------------------------------------------------------------- The following portions of the patient's history were reviewed and updated as appropriate: allergies, current medications, past family history, past medical history, past social history, past surgical history and problem list. Problem list updated.  Objective  Blood pressure 122/74, weight 219 lb (99.3 kg), last menstrual period 10/12/2019. Pregravid weight 195 lb (88.5 kg) Total Weight Gain 24 lb (10.9 kg) Urinalysis: Urine Protein    Urine Glucose    Fetal Status: Fetal Heart Rate (bpm): 140   Movement: Present      NST: Reactive 30 minute tracing with acoustic stimulation and a cup of sweet tea. Baseline 140 bpm, moderate variability, 3 15x15 accelerations, -decelerations  General:  Alert, oriented and cooperative. Patient is in no acute distress.  Skin: Skin is warm and dry. No rash noted.   Cardiovascular: Normal heart rate noted  Respiratory: Normal respiratory effort, no problems with respiration noted  Abdomen: Soft, gravid, appropriate for gestational age. Pain/Pressure: Absent     Pelvic:  Cervical exam deferred        Extremities:  Normal range of motion.  Edema: None  Mental Status: Normal mood and affect. Normal behavior. Normal judgment and thought content.   Assessment   29 y.o. G2P0010 at [redacted]w[redacted]d by  07/18/2020, by Last Menstrual Period presenting for routine prenatal visit  Plan   SECOND Problems (from 11/22/19 to present)    Problem Noted Resolved   Supervision of other normal pregnancy, antepartum 11/23/2019 by Farrel Conners, CNM No   Overview Addendum 06/27/2020  1:10 PM by Farrel Conners, CNM    [facility] Prenatal Labs  Dating LMP= 8US Blood type: AB/Positive/-- (11/23 1431)   Genetic Screen  NIPS: declines Antibody:Negative (11/23 1431)  Anatomic [place] Normal female Rubella: 13.80 (11/23 1431) Varicella:Immune  GTT   Third trimester: 106 RPR: Non Reactive (11/23 1431)   Rhogam n/a HBsAg: Negative (11/23 1431)   TDaP vaccine   05/22/2020   Flu Shot:09/2019 HIV: Non Reactive (11/23 1431)   Baby Food  Breast GBS: negative  Contraception   Pap: 1/21; abn in past, needs repeat pp  CBB   No   CS/VBAC  N/A   Support Person  Husband          Previous Version   History of ectopic pregnancy 05/26/2019 by Nadara Mustard, MD No       Term labor symptoms and general obstetric precautions including but not limited to vaginal bleeding, contractions, leaking of fluid and fetal movement were reviewed in detail with the patient. Please refer to After Visit Summary for other counseling recommendations.   Return in about 1 week (around 07/04/2020) for nst and rob.  Tresea Mall, CNM 06/27/2020 4:27 PM

## 2020-06-27 NOTE — Progress Notes (Signed)
No vb. No lof. NST today °

## 2020-06-27 NOTE — Patient Instructions (Signed)
Braxton Hicks Contractions °Contractions of the uterus can occur throughout pregnancy, but they are not always a sign that you are in labor. You may have practice contractions called Braxton Hicks contractions. These false labor contractions are sometimes confused with true labor. °What are Braxton Hicks contractions? °Braxton Hicks contractions are tightening movements that occur in the muscles of the uterus before labor. Unlike true labor contractions, these contractions do not result in opening (dilation) and thinning of the cervix. Toward the end of pregnancy (32-34 weeks), Braxton Hicks contractions can happen more often and may become stronger. These contractions are sometimes difficult to tell apart from true labor because they can be very uncomfortable. You should not feel embarrassed if you go to the hospital with false labor. °Sometimes, the only way to tell if you are in true labor is for your health care provider to look for changes in the cervix. The health care provider will do a physical exam and may monitor your contractions. If you are not in true labor, the exam should show that your cervix is not dilating and your water has not broken. °If there are no other health problems associated with your pregnancy, it is completely safe for you to be sent home with false labor. You may continue to have Braxton Hicks contractions until you go into true labor. °How to tell the difference between true labor and false labor °True labor °· Contractions last 30-70 seconds. °· Contractions become very regular. °· Discomfort is usually felt in the top of the uterus, and it spreads to the lower abdomen and low back. °· Contractions do not go away with walking. °· Contractions usually become more intense and increase in frequency. °· The cervix dilates and gets thinner. °False labor °· Contractions are usually shorter and not as strong as true labor contractions. °· Contractions are usually irregular. °· Contractions  are often felt in the front of the lower abdomen and in the groin. °· Contractions may go away when you walk around or change positions while lying down. °· Contractions get weaker and are shorter-lasting as time goes on. °· The cervix usually does not dilate or become thin. °Follow these instructions at home: ° °· Take over-the-counter and prescription medicines only as told by your health care provider. °· Keep up with your usual exercises and follow other instructions from your health care provider. °· Eat and drink lightly if you think you are going into labor. °· If Braxton Hicks contractions are making you uncomfortable: °? Change your position from lying down or resting to walking, or change from walking to resting. °? Sit and rest in a tub of warm water. °? Drink enough fluid to keep your urine pale yellow. Dehydration may cause these contractions. °? Do slow and deep breathing several times an hour. °· Keep all follow-up prenatal visits as told by your health care provider. This is important. °Contact a health care provider if: °· You have a fever. °· You have continuous pain in your abdomen. °Get help right away if: °· Your contractions become stronger, more regular, and closer together. °· You have fluid leaking or gushing from your vagina. °· You pass blood-tinged mucus (bloody show). °· You have bleeding from your vagina. °· You have low back pain that you never had before. °· You feel your baby’s head pushing down and causing pelvic pressure. °· Your baby is not moving inside you as much as it used to. °Summary °· Contractions that occur before labor are   called Braxton Hicks contractions, false labor, or practice contractions.  Braxton Hicks contractions are usually shorter, weaker, farther apart, and less regular than true labor contractions. True labor contractions usually become progressively stronger and regular, and they become more frequent.  Manage discomfort from [facility] contractions  by changing position, resting in a warm bath, drinking plenty of water, or practicing deep breathing. This information is not intended to replace advice given to you by your health care provider. Make sure you discuss any questions you have with your health care provider. Document Revised: 11/28/2017 Document Reviewed: 05/01/2017 Elsevier Patient Education  2020 [employer] Pain Relief During Labor and Delivery Many things can cause pain during labor and delivery, including:  Pressure on bones and ligaments due to the baby moving through the pelvis.  Stretching of tissues due to the baby moving through the birth canal.  Muscle tension due to anxiety or nervousness.  The uterus tightening (contracting) and relaxing to help move the baby. There are many ways to deal with the pain of labor and delivery. They include:  Taking prenatal classes. Taking these classes helps you know what to expect during your babys birth. What you learn will increase your confidence and decrease your anxiety.  Practicing relaxation techniques or doing relaxing activities, such as: ? Focused breathing. ? Meditation. ? Visualization. ? Aroma therapy. ? Listening to your favorite music. ? Hypnosis.  Taking a warm shower or bath (hydrotherapy). This may: ? Provide comfort and relaxation. ? Lessen your perception of pain. ? Decrease the amount of pain medicine needed. ? Decrease the length of labor.  Getting a massage or counterpressure on your back.  Applying warm packs or ice packs.  Changing positions often, moving around, or using a birthing ball.  Getting: ? Pain medicine through an IV or injection into a muscle. ? Pain medicine inserted into your spinal column. ? Injections of sterile water just under the skin on your lower back (intradermal injections). ? Laughing gas (nitrous oxide). Discuss your pain control options with your health care provider during your prenatal visits. Explore the  options offered by your hospital or birth center. What kinds of medicine are available? There are two kinds of medicines that can be used to relieve pain during labor and delivery:  Analgesics. These medicines decrease pain without causing you to lose feeling or the ability to move your muscles.  Anesthetics. These medicines block feeling in the body and can decrease your ability to move freely. Both of these kinds of medicine can cause minor side effects, such as nausea, trouble concentrating, and sleepiness. They can also decrease the baby's heart rate before birth and affect the babys breathing rate after birth. For this reason, health care providers are careful about when and how much medicine is given. What are specific medicines and procedures that provide pain relief? Local Anesthetics Local anesthetics are used to numb a small area of the body. They may be used along with another kind of anesthetic or used to numb the nerves of the vagina, cervix, and perineum during the second stage of labor. General Anesthetics General anesthetics cause you to lose consciousness so you do not feel pain. They are usually only used for an emergency cesarean delivery. General anesthetics are given through an IV tube and a mask. Pudendal Block A pudendal block is a form of local anesthetic. It may be used to relieve the pain associated with pushing or stretching of the perineum at the time of delivery or  to further numb the perineum. A pudendal block is done by injecting numbing medicine through the vaginal wall into a nerve in the pelvis. Epidural Analgesia Epidural analgesia is given through a flexible IV catheter that is inserted into the lower back. Numbing medicine is delivered continuously to the area near your spinal column nerves (epidural space). After having this type of analgesia, you may be able to move your legs but you most likely will not be able to walk. Depending on the amount of medicine  given, you may lose all feeling in the lower half of your body, or you may retain some level of sensation, including the urge to push. Epidural analgesia can be used to provide pain relief for a vaginal birth. Spinal Block A spinal block is similar to epidural analgesia, but the medicine is injected into the spinal fluid instead of the epidural space. A spinal block is only given once. It starts to relieve pain quickly, but the pain relief lasts only 1-6 hours. Spinal blocks can be used for cesarean deliveries. Combined Spinal-Epidural (CSE) Block A CSE block combines the effects of a spinal block and epidural analgesia. The spinal block works quickly to block all pain. The epidural analgesia provides continuous pain relief, even after the effects of the spinal block have worn off. This information is not intended to replace advice given to you by your health care provider. Make sure you discuss any questions you have with your health care provider. Document Revised: 11/28/2017 Document Reviewed: 05/08/2016 Elsevier Patient Education  2020 [employer] Vaginal Delivery  Vaginal delivery means that you give birth by pushing your baby out of your birth canal (vagina). A team of health care providers will help you before, during, and after vaginal delivery. Birth experiences are unique for every woman and every pregnancy, and birth experiences vary depending on where you choose to give birth. What happens when I arrive at the birth center or hospital? Once you are in labor and have been admitted into the hospital or birth center, your health care provider may:  Review your pregnancy history and any concerns that you have.  Insert an IV into one of your veins. This may be used to give you fluids and medicines.  Check your blood pressure, pulse, temperature, and heart rate (vital signs).  Check whether your bag of water (amniotic sac) has broken (ruptured).  Talk with you about your birth plan and  discuss pain control options. Monitoring Your health care provider may monitor your contractions (uterine monitoring) and your baby's heart rate (fetal monitoring). You may need to be monitored:  Often, but not continuously (intermittently).  All the time or for long periods at a time (continuously). Continuous monitoring may be needed if: ? You are taking certain medicines, such as medicine to relieve pain or make your contractions stronger. ? You have pregnancy or labor complications. Monitoring may be done by:  Placing a special stethoscope or a handheld monitoring device on your abdomen to check your baby's heartbeat and to check for contractions.  Placing monitors on your abdomen (external monitors) to record your baby's heartbeat and the frequency and length of contractions.  Placing monitors inside your uterus through your vagina (internal monitors) to record your baby's heartbeat and the frequency, length, and strength of your contractions. Depending on the type of monitor, it may remain in your uterus or on your baby's head until birth.  Telemetry. This is a type of continuous monitoring that can be done with  external or internal monitors. Instead of having to stay in bed, you are able to move around during telemetry. Physical exam Your health care provider may perform frequent physical exams. This may include:  Checking how and where your baby is positioned in your uterus.  Checking your cervix to determine: ? Whether it is thinning out (effacing). ? Whether it is opening up (dilating). What happens during labor and delivery?  Normal labor and delivery is divided into the following three stages: Stage 1  This is the longest stage of labor.  This stage can last for hours or days.  Throughout this stage, you will feel contractions. Contractions generally feel mild, infrequent, and irregular at first. They get stronger, more frequent (about every 2-3 minutes), and more  regular as you move through this stage.  This stage ends when your cervix is completely dilated to 4 inches (10 cm) and completely effaced. Stage 2  This stage starts once your cervix is completely effaced and dilated and lasts until the delivery of your baby.  This stage may last from 20 minutes to 2 hours.  This is the stage where you will feel an urge to push your baby out of your vagina.  You may feel stretching and burning pain, especially when the widest part of your baby's head passes through the vaginal opening (crowning).  Once your baby is delivered, the umbilical cord will be clamped and cut. This usually occurs after waiting a period of 1-2 minutes after delivery.  Your baby will be placed on your bare chest (skin-to-skin contact) in an upright position and covered with a warm blanket. Watch your baby for feeding cues, like rooting or sucking, and help the baby to your breast for his or her first feeding. Stage 3  This stage starts immediately after the birth of your baby and ends after you deliver the placenta.  This stage may take anywhere from 5 to 30 minutes.  After your baby has been delivered, you will feel contractions as your body expels the placenta and your uterus contracts to control bleeding. What can I expect after labor and delivery?  After labor is over, you and your baby will be monitored closely until you are ready to go home to ensure that you are both healthy. Your health care team will teach you how to care for yourself and your baby.  You and your baby will stay in the same room (rooming in) during your hospital stay. This will encourage early bonding and successful breastfeeding.  You may continue to receive fluids and medicines through an IV.  Your uterus will be checked and massaged regularly (fundal massage).  You will have some soreness and pain in your abdomen, vagina, and the area of skin between your vaginal opening and your anus  (perineum).  If an incision was made near your vagina (episiotomy) or if you had some vaginal tearing during delivery, cold compresses may be placed on your episiotomy or your tear. This helps to reduce pain and swelling.  You may be given a squirt bottle to use instead of wiping when you go to the bathroom. To use the squirt bottle, follow these steps: ? Before you urinate, fill the squirt bottle with warm water. Do not use hot water. ? After you urinate, while you are sitting on the toilet, use the squirt bottle to rinse the area around your urethra and vaginal opening. This rinses away any urine and blood. ? Fill the squirt bottle with clean  water every time you use the bathroom.  It is normal to have vaginal bleeding after delivery. Wear a sanitary pad for vaginal bleeding and discharge. Summary  Vaginal delivery means that you will give birth by pushing your baby out of your birth canal (vagina).  Your health care provider may monitor your contractions (uterine monitoring) and your baby's heart rate (fetal monitoring).  Your health care provider may perform a physical exam.  Normal labor and delivery is divided into three stages.  After labor is over, you and your baby will be monitored closely until you are ready to go home. This information is not intended to replace advice given to you by your health care provider. Make sure you discuss any questions you have with your health care provider. Document Revised: 01/20/2018 Document Reviewed: 01/20/2018 Elsevier Patient Education  2020 [employer].

## 2020-07-04 ENCOUNTER — Ambulatory Visit (INDEPENDENT_AMBULATORY_CARE_PROVIDER_SITE_OTHER): Payer: Managed Care, Other (non HMO) | Admitting: Advanced Practice Midwife

## 2020-07-04 ENCOUNTER — Other Ambulatory Visit: Payer: Self-pay

## 2020-07-04 ENCOUNTER — Encounter: Payer: Self-pay | Admitting: Advanced Practice Midwife

## 2020-07-04 VITALS — BP 126/80 | Wt 220.0 lb

## 2020-07-04 DIAGNOSIS — Z3A38 38 weeks gestation of pregnancy: Secondary | ICD-10-CM | POA: Diagnosis not present

## 2020-07-04 DIAGNOSIS — E669 Obesity, unspecified: Secondary | ICD-10-CM | POA: Diagnosis not present

## 2020-07-04 DIAGNOSIS — Z3480 Encounter for supervision of other normal pregnancy, unspecified trimester: Secondary | ICD-10-CM

## 2020-07-04 DIAGNOSIS — Z3483 Encounter for supervision of other normal pregnancy, third trimester: Secondary | ICD-10-CM

## 2020-07-04 LAB — POCT URINALYSIS DIPSTICK OB
Glucose, UA: NEGATIVE
POC,PROTEIN,UA: NEGATIVE

## 2020-07-04 NOTE — Progress Notes (Signed)
ROB NST today

## 2020-07-04 NOTE — Progress Notes (Signed)
  Routine Prenatal Care Visit  Subjective  Megan Newman is a 29 y.o. G2P0010 at [redacted]w[redacted]d being seen today for ongoing prenatal care.  She is currently monitored for the following issues for this low-risk pregnancy and has Ruptured right tubal ectopic pregnancy causing hemoperitoneum; History of ectopic pregnancy; Migraine; Anxiety; Supervision of other normal pregnancy, antepartum; History of abnormal cervical Pap smear; Irregular menstrual bleeding; Pelvic pain in female; Obesity in pregnancy; and Obesity (BMI 35.0-39.9 without comorbidity) on their problem list.  ----------------------------------------------------------------------------------- Patient reports some foot swelling and back/hip pain.   Contractions: Irregular. Vag. Bleeding: None.  Movement: Present. Leaking Fluid denies.  ----------------------------------------------------------------------------------- The following portions of the patient's history were reviewed and updated as appropriate: allergies, current medications, past family history, past medical history, past social history, past surgical history and problem list. Problem list updated.  Objective  Blood pressure 126/80, weight 220 lb (99.8 kg), last menstrual period 10/12/2019. Pregravid weight 195 lb (88.5 kg) Total Weight Gain 25 lb (11.3 kg) Urinalysis: Urine Protein Negative  Urine Glucose Negative  Fetal Status: Fetal Heart Rate (bpm): 150   Movement: Present  Presentation: Vertex   NST: reactive 30 minute tracing, 150 bpm baseline, moderate variability, +accelerations, -decelerations  General:  Alert, oriented and cooperative. Patient is in no acute distress.  Skin: Skin is warm and dry. No rash noted.   Cardiovascular: Normal heart rate noted  Respiratory: Normal respiratory effort, no problems with respiration noted  Abdomen: Soft, gravid, appropriate for gestational age. Pain/Pressure: Absent     Pelvic:  Cervical exam performed Dilation: 1  Effacement (%): 60, 70 Station: -3, -2, sweep declined  Extremities: Normal range of motion.  Edema: Trace  Mental Status: Normal mood and affect. Normal behavior. Normal judgment and thought content.   Assessment   29 y.o. G2P0010 at [redacted]w[redacted]d by  07/18/2020, by Last Menstrual Period presenting for routine prenatal visit  Plan   SECOND Problems (from 11/22/19 to present)    Problem Noted Resolved   Supervision of other normal pregnancy, antepartum 11/23/2019 by Farrel Conners, CNM No   Overview Addendum 06/27/2020  1:10 PM by Farrel Conners, CNM    Clinic [facility] Prenatal Labs  Dating LMP= 8US Blood type: AB/Positive/-- (11/23 1431)   Genetic Screen  NIPS: declines Antibody:Negative (11/23 1431)  Anatomic [place] Normal female Rubella: 13.80 (11/23 1431) Varicella:Immune  GTT   Third trimester: 106 RPR: Non Reactive (11/23 1431)   Rhogam n/a HBsAg: Negative (11/23 1431)   TDaP vaccine   05/22/2020   Flu Shot:09/2019 HIV: Non Reactive (11/23 1431)   Baby Food  Breast GBS: negative  Contraception   Pap: 1/21; abn in past, needs repeat pp  CBB   No   CS/VBAC  N/A   Support Person  Husband          Previous Version   History of ectopic pregnancy 05/26/2019 by Nadara Mustard, MD No    Back/hip pain: abdominal support band, epsom salt soaks, hands and knees Leg swelling: increase hydration, elevate legs   Term labor symptoms and general obstetric precautions including but not limited to vaginal bleeding, contractions, leaking of fluid and fetal movement were reviewed in detail with the patient. Please refer to After Visit Summary for other counseling recommendations.   Return in about 1 week (around 07/11/2020) for nst and rob.  Tresea Mall, CNM 07/04/2020 4:25 PM

## 2020-07-11 ENCOUNTER — Encounter: Payer: Self-pay | Admitting: Advanced Practice Midwife

## 2020-07-11 ENCOUNTER — Ambulatory Visit (INDEPENDENT_AMBULATORY_CARE_PROVIDER_SITE_OTHER): Payer: Managed Care, Other (non HMO) | Admitting: Advanced Practice Midwife

## 2020-07-11 ENCOUNTER — Other Ambulatory Visit: Payer: Self-pay

## 2020-07-11 VITALS — BP 120/80 | Wt 222.0 lb

## 2020-07-11 DIAGNOSIS — Z3480 Encounter for supervision of other normal pregnancy, unspecified trimester: Secondary | ICD-10-CM | POA: Diagnosis not present

## 2020-07-11 DIAGNOSIS — Z3A39 39 weeks gestation of pregnancy: Secondary | ICD-10-CM | POA: Diagnosis not present

## 2020-07-11 DIAGNOSIS — O99210 Obesity complicating pregnancy, unspecified trimester: Secondary | ICD-10-CM | POA: Diagnosis not present

## 2020-07-11 LAB — FETAL NONSTRESS TEST

## 2020-07-11 NOTE — Progress Notes (Addendum)
°  Routine Prenatal Care Visit  Subjective  Megan Newman is a 29 y.o. G2P0010 at [redacted]w[redacted]d being seen today for ongoing prenatal care.  She is currently monitored for the following issues for this low-risk pregnancy and has Ruptured right tubal ectopic pregnancy causing hemoperitoneum; History of ectopic pregnancy; Migraine; Anxiety; Supervision of other normal pregnancy, antepartum; History of abnormal cervical Pap smear; Irregular menstrual bleeding; Pelvic pain in female; Obesity in pregnancy; and Obesity (BMI 35.0-39.9 without comorbidity) on their problem list.  ----------------------------------------------------------------------------------- Patient reports doing well. Her labor questions are answered.   Contractions: Irregular. Vag. Bleeding: None.  Movement: Present. Leaking Fluid denies.  ----------------------------------------------------------------------------------- The following portions of the patient's history were reviewed and updated as appropriate: allergies, current medications, past family history, past medical history, past social history, past surgical history and problem list. Problem list updated.  Objective  Blood pressure 120/80, weight 222 lb (100.7 kg), last menstrual period 10/12/2019. Pregravid weight 195 lb (88.5 kg) Total Weight Gain 27 lb (12.2 kg) Urinalysis: Urine Protein    Urine Glucose    Fetal Status: Fetal Heart Rate (bpm): 155 Fundal Height: 39 cm Movement: Present  Presentation: Vertex   NST reactive 20 minute tracing, baseline 155 bpm, moderate variability, +accelerations, -decelerations  General:  Alert, oriented and cooperative. Patient is in no acute distress.  Skin: Skin is warm and dry. No rash noted.   Cardiovascular: Normal heart rate noted  Respiratory: Normal respiratory effort, no problems with respiration noted  Abdomen: Soft, gravid, appropriate for gestational age. Pain/Pressure: Present     Pelvic:  cervical sweep Dilation: 1.5  Effacement (%): 60, 70 Station: -2  Extremities: Normal range of motion.  Edema: None  Mental Status: Normal mood and affect. Normal behavior. Normal judgment and thought content.   Assessment   29 y.o. G2P0010 at [redacted]w[redacted]d by  07/18/2020, by Last Menstrual Period presenting for routine prenatal visit  Plan   SECOND Problems (from 11/22/19 to present)    Problem Noted Resolved   Supervision of other normal pregnancy, antepartum 11/23/2019 by Farrel Conners, CNM No   Overview Addendum 06/27/2020  1:10 PM by Farrel Conners, CNM    Clinic [facility] Prenatal Labs  Dating LMP= 8US Blood type: AB/Positive/-- (11/23 1431)   Genetic Screen  NIPS: declines Antibody:Negative (11/23 1431)  Anatomic [place] Normal female Rubella: 13.80 (11/23 1431) Varicella:Immune  GTT   Third trimester: 106 RPR: Non Reactive (11/23 1431)   Rhogam n/a HBsAg: Negative (11/23 1431)   TDaP vaccine   05/22/2020   Flu Shot:09/2019 HIV: Non Reactive (11/23 1431)   Baby Food  Breast GBS: negative  Contraception   Pap: 1/21; abn in past, needs repeat pp  CBB   No   CS/VBAC  N/A   Support Person  Husband          Previous Version   History of ectopic pregnancy 05/26/2019 by Nadara Mustard, MD No       Term labor symptoms and general obstetric precautions including but not limited to vaginal bleeding, contractions, leaking of fluid and fetal movement were reviewed in detail with the patient.   Return in about 1 week (around 07/18/2020) for afi/nst/rob.  Tresea Mall, CNM 07/11/2020 3:01 PM

## 2020-07-17 ENCOUNTER — Encounter: Payer: Self-pay | Admitting: Obstetrics and Gynecology

## 2020-07-17 ENCOUNTER — Ambulatory Visit (INDEPENDENT_AMBULATORY_CARE_PROVIDER_SITE_OTHER): Payer: Managed Care, Other (non HMO) | Admitting: Obstetrics and Gynecology

## 2020-07-17 ENCOUNTER — Other Ambulatory Visit: Payer: Self-pay

## 2020-07-17 ENCOUNTER — Ambulatory Visit (INDEPENDENT_AMBULATORY_CARE_PROVIDER_SITE_OTHER): Payer: Managed Care, Other (non HMO)

## 2020-07-17 VITALS — BP 118/72 | Ht 66.0 in | Wt 223.2 lb

## 2020-07-17 DIAGNOSIS — Z3480 Encounter for supervision of other normal pregnancy, unspecified trimester: Secondary | ICD-10-CM

## 2020-07-17 DIAGNOSIS — Z3A39 39 weeks gestation of pregnancy: Secondary | ICD-10-CM

## 2020-07-17 DIAGNOSIS — O99210 Obesity complicating pregnancy, unspecified trimester: Secondary | ICD-10-CM

## 2020-07-17 DIAGNOSIS — O99213 Obesity complicating pregnancy, third trimester: Secondary | ICD-10-CM | POA: Diagnosis not present

## 2020-07-17 LAB — POCT URINALYSIS DIPSTICK OB
Glucose, UA: NEGATIVE
POC,PROTEIN,UA: NEGATIVE

## 2020-07-17 NOTE — Patient Instructions (Addendum)
Covid testing Friday 07/21/2020 9-10:30am - In front of [facility], [address], Wear a mask. Induction Sunday 07/23/2020 5:00am [facility] through [facility]   Labor Induction  Labor induction is when steps are taken to cause a pregnant woman to begin the labor process. Most women go into labor on their own between 37 weeks and 42 weeks of pregnancy. When this does not happen or when there is a medical need for labor to begin, steps may be taken to induce labor. Labor induction causes a pregnant woman's uterus to contract. It also causes the cervix to soften (ripen), open (dilate), and thin out (efface). Usually, labor is not induced before 39 weeks of pregnancy unless there is a medical reason to do so. Your health care provider will determine if labor induction is needed. Before inducing labor, your health care provider will consider a number of factors, including:  Your medical condition and your baby's.  How many weeks along you are in your pregnancy.  How mature your baby's lungs are.  The condition of your cervix.  The position of your baby.  The size of your birth canal. What are some reasons for labor induction? Labor may be induced if:  Your health or your baby's health is at risk.  Your pregnancy is overdue by 1 week or more.  Your water breaks but labor does not start on its own.  There is a low amount of amniotic fluid around your baby. You may also choose (elect) to have labor induced at a certain time. Generally, elective labor induction is done no earlier than 39 weeks of pregnancy. What methods are used for labor induction? Methods used for labor induction include:  Prostaglandin medicine. This medicine starts contractions and causes the cervix to dilate and ripen. It can be taken by mouth (orally) or by being inserted into the vagina (suppository).  Inserting a small, thin tube (catheter) with a balloon into the vagina and then expanding the balloon with water to  dilate the cervix.  Stripping the membranes. In this method, your health care provider gently separates amniotic sac tissue from the cervix. This causes the cervix to stretch, which in turn causes the release of a hormone called progesterone. The hormone causes the uterus to contract. This procedure is often done during an office visit, after which you will be sent home to wait for contractions to begin.  Breaking the water. In this method, your health care provider uses a small instrument to make a small hole in the amniotic sac. This eventually causes the amniotic sac to break. Contractions should begin after a few hours.  Medicine to trigger or strengthen contractions. This medicine is given through an IV that is inserted into a vein in your arm. Except for membrane stripping, which can be done in a clinic, labor induction is done in the hospital so that you and your baby can be carefully monitored. How long does it take for labor to be induced? The length of time it takes to induce labor depends on how ready your body is for labor. Some inductions can take up to 2-3 days, while others may take less than a day. Induction may take longer if:  You are induced early in your pregnancy.  It is your first pregnancy.  Your cervix is not ready. What are some risks associated with labor induction? Some risks associated with labor induction include:  Changes in fetal heart rate, such as being too high, too low, or irregular (  erratic).  Failed induction.  Infection in the mother or the baby.  Increased risk of having a cesarean delivery.  Fetal death.  Breaking off (abruption) of the placenta from the uterus (rare).  Rupture of the uterus (very rare). When induction is needed for medical reasons, the benefits of induction generally outweigh the risks. What are some reasons for not inducing labor? Labor induction should not be done if:  Your baby does not tolerate contractions.  You have  had previous surgeries on your uterus, such as a myomectomy, removal of fibroids, or a vertical scar from a previous cesarean delivery.  Your placenta lies very low in your uterus and blocks the opening of the cervix (placenta previa).  Your baby is not in a head-down position.  The umbilical cord drops down into the birth canal in front of the baby.  There are unusual circumstances, such as the baby being very early (premature).  You have had more than 2 previous cesarean deliveries. Summary  Labor induction is when steps are taken to cause a pregnant woman to begin the labor process.  Labor induction causes a pregnant woman's uterus to contract. It also causes the cervix to ripen, dilate, and efface.  Labor is not induced before 39 weeks of pregnancy unless there is a medical reason to do so.  When induction is needed for medical reasons, the benefits of induction generally outweigh the risks. This information is not intended to replace advice given to you by your health care provider. Make sure you discuss any questions you have with your health care provider. Document Revised: 12/19/2017 Document Reviewed: 01/29/2017 Elsevier Patient Education  2020 [employer]   Vaginal Delivery  Vaginal delivery means that you give birth by pushing your baby out of your birth canal (vagina). A team of health care providers will help you before, during, and after vaginal delivery. Birth experiences are unique for every woman and every pregnancy, and birth experiences vary depending on where you choose to give birth. What happens when I arrive at the birth center or hospital? Once you are in labor and have been admitted into the hospital or birth center, your health care provider may:  Review your pregnancy history and any concerns that you have.  Insert an IV into one of your veins. This may be used to give you fluids and medicines.  Check your blood pressure, pulse, temperature, and heart  rate (vital signs).  Check whether your bag of water (amniotic sac) has broken (ruptured).  Talk with you about your birth plan and discuss pain control options. Monitoring Your health care provider may monitor your contractions (uterine monitoring) and your baby's heart rate (fetal monitoring). You may need to be monitored:  Often, but not continuously (intermittently).  All the time or for long periods at a time (continuously). Continuous monitoring may be needed if: ? You are taking certain medicines, such as medicine to relieve pain or make your contractions stronger. ? You have pregnancy or labor complications. Monitoring may be done by:  Placing a special stethoscope or a handheld monitoring device on your abdomen to check your baby's heartbeat and to check for contractions.  Placing monitors on your abdomen (external monitors) to record your baby's heartbeat and the frequency and length of contractions.  Placing monitors inside your uterus through your vagina (internal monitors) to record your baby's heartbeat and the frequency, length, and strength of your contractions. Depending on the type of monitor, it may remain in your uterus  or on your baby's head until birth.  Telemetry. This is a type of continuous monitoring that can be done with external or internal monitors. Instead of having to stay in bed, you are able to move around during telemetry. Physical exam Your health care provider may perform frequent physical exams. This may include:  Checking how and where your baby is positioned in your uterus.  Checking your cervix to determine: ? Whether it is thinning out (effacing). ? Whether it is opening up (dilating). What happens during labor and delivery?  Normal labor and delivery is divided into the following three stages: Stage 1  This is the longest stage of labor.  This stage can last for hours or days.  Throughout this stage, you will feel contractions.  Contractions generally feel mild, infrequent, and irregular at first. They get stronger, more frequent (about every 2-3 minutes), and more regular as you move through this stage.  This stage ends when your cervix is completely dilated to 4 inches (10 cm) and completely effaced. Stage 2  This stage starts once your cervix is completely effaced and dilated and lasts until the delivery of your baby.  This stage may last from 20 minutes to 2 hours.  This is the stage where you will feel an urge to push your baby out of your vagina.  You may feel stretching and burning pain, especially when the widest part of your baby's head passes through the vaginal opening (crowning).  Once your baby is delivered, the umbilical cord will be clamped and cut. This usually occurs after waiting a period of 1-2 minutes after delivery.  Your baby will be placed on your bare chest (skin-to-skin contact) in an upright position and covered with a warm blanket. Watch your baby for feeding cues, like rooting or sucking, and help the baby to your breast for his or her first feeding. Stage 3  This stage starts immediately after the birth of your baby and ends after you deliver the placenta.  This stage may take anywhere from 5 to 30 minutes.  After your baby has been delivered, you will feel contractions as your body expels the placenta and your uterus contracts to control bleeding. What can I expect after labor and delivery?  After labor is over, you and your baby will be monitored closely until you are ready to go home to ensure that you are both healthy. Your health care team will teach you how to care for yourself and your baby.  You and your baby will stay in the same room (rooming in) during your hospital stay. This will encourage early bonding and successful breastfeeding.  You may continue to receive fluids and medicines through an IV.  Your uterus will be checked and massaged regularly (fundal  massage).  You will have some soreness and pain in your abdomen, vagina, and the area of skin between your vaginal opening and your anus (perineum).  If an incision was made near your vagina (episiotomy) or if you had some vaginal tearing during delivery, cold compresses may be placed on your episiotomy or your tear. This helps to reduce pain and swelling.  You may be given a squirt bottle to use instead of wiping when you go to the bathroom. To use the squirt bottle, follow these steps: ? Before you urinate, fill the squirt bottle with warm water. Do not use hot water. ? After you urinate, while you are sitting on the toilet, use the squirt bottle to rinse the  area around your urethra and vaginal opening. This rinses away any urine and blood. ? Fill the squirt bottle with clean water every time you use the bathroom.  It is normal to have vaginal bleeding after delivery. Wear a sanitary pad for vaginal bleeding and discharge. Summary  Vaginal delivery means that you will give birth by pushing your baby out of your birth canal (vagina).  Your health care provider may monitor your contractions (uterine monitoring) and your baby's heart rate (fetal monitoring).  Your health care provider may perform a physical exam.  Normal labor and delivery is divided into three stages.  After labor is over, you and your baby will be monitored closely until you are ready to go home. This information is not intended to replace advice given to you by your health care provider. Make sure you discuss any questions you have with your health care provider. Document Revised: 01/20/2018 Document Reviewed: 01/20/2018 Elsevier Patient Education  2020 [employer]   Pain Relief During Labor and Delivery Many things can cause pain during labor and delivery, including:  Pressure on bones and ligaments due to the baby moving through the pelvis.  Stretching of tissues due to the baby moving through the birth  canal.  Muscle tension due to anxiety or nervousness.  The uterus tightening (contracting) and relaxing to help move the baby. There are many ways to deal with the pain of labor and delivery. They include:  Taking prenatal classes. Taking these classes helps you know what to expect during your baby's birth. What you learn will increase your confidence and decrease your anxiety.  Practicing relaxation techniques or doing relaxing activities, such as: ? Focused breathing. ? Meditation. ? Visualization. ? Aroma therapy. ? Listening to your favorite music. ? Hypnosis.  Taking a warm shower or bath (hydrotherapy). This may: ? Provide comfort and relaxation. ? Lessen your perception of pain. ? Decrease the amount of pain medicine needed. ? Decrease the length of labor.  Getting a massage or counterpressure on your back.  Applying warm packs or ice packs.  Changing positions often, moving around, or using a birthing ball.  Getting: ? Pain medicine through an IV or injection into a muscle. ? Pain medicine inserted into your spinal column. ? Injections of sterile water just under the skin on your lower back (intradermal injections). ? Laughing gas (nitrous oxide). Discuss your pain control options with your health care provider during your prenatal visits. Explore the options offered by your hospital or birth center. What kinds of medicine are available? There are two kinds of medicines that can be used to relieve pain during labor and delivery:  Analgesics. These medicines decrease pain without causing you to lose feeling or the ability to move your muscles.  Anesthetics. These medicines block feeling in the body and can decrease your ability to move freely. Both of these kinds of medicine can cause minor side effects, such as nausea, trouble concentrating, and sleepiness. They can also decrease the baby's heart rate before birth and affect the baby's breathing rate after birth. For  this reason, health care providers are careful about when and how much medicine is given. What are specific medicines and procedures that provide pain relief? Local Anesthetics Local anesthetics are used to numb a small area of the body. They may be used along with another kind of anesthetic or used to numb the nerves of the vagina, cervix, and perineum during the second stage of labor. General Anesthetics General anesthetics cause  you to lose consciousness so you do not feel pain. They are usually only used for an emergency cesarean delivery. General anesthetics are given through an IV tube and a mask. Pudendal Block A pudendal block is a form of local anesthetic. It may be used to relieve the pain associated with pushing or stretching of the perineum at the time of delivery or to further numb the perineum. A pudendal block is done by injecting numbing medicine through the vaginal wall into a nerve in the pelvis. Epidural Analgesia Epidural analgesia is given through a flexible IV catheter that is inserted into the lower back. Numbing medicine is delivered continuously to the area near your spinal column nerves (epidural space). After having this type of analgesia, you may be able to move your legs but you most likely will not be able to walk. Depending on the amount of medicine given, you may lose all feeling in the lower half of your body, or you may retain some level of sensation, including the urge to push. Epidural analgesia can be used to provide pain relief for a vaginal birth. Spinal Block A spinal block is similar to epidural analgesia, but the medicine is injected into the spinal fluid instead of the epidural space. A spinal block is only given once. It starts to relieve pain quickly, but the pain relief lasts only 1-6 hours. Spinal blocks can be used for cesarean deliveries. Combined Spinal-Epidural (CSE) Block A CSE block combines the effects of a spinal block and epidural analgesia. The  spinal block works quickly to block all pain. The epidural analgesia provides continuous pain relief, even after the effects of the spinal block have worn off. This information is not intended to replace advice given to you by your health care provider. Make sure you discuss any questions you have with your health care provider. Document Revised: 11/28/2017 Document Reviewed: 05/08/2016 Elsevier Patient Education  2020 [employer].

## 2020-07-17 NOTE — Progress Notes (Signed)
     Routine Prenatal Care Visit  Subjective  Megan Newman is a 29 y.o. G2P0010 at [redacted]w[redacted]d being seen today for ongoing prenatal care.  She is currently monitored for the following issues for this low-risk pregnancy and has Ruptured right tubal ectopic pregnancy causing hemoperitoneum; History of ectopic pregnancy; Migraine; Anxiety; Supervision of other normal pregnancy, antepartum; History of abnormal cervical Pap smear; Irregular menstrual bleeding; Pelvic pain in female; Obesity in pregnancy; and Obesity (BMI 35.0-39.9 without comorbidity) on their problem list.  ----------------------------------------------------------------------------------- Patient reports no complaints.   Contractions: Irregular. Vag. Bleeding: None.  Movement: Present. Denies leaking of fluid.  ----------------------------------------------------------------------------------- The following portions of the patient's history were reviewed and updated as appropriate: allergies, current medications, past family history, past medical history, past social history, past surgical history and problem list. Problem list updated.   Objective  Blood pressure 118/72, height 5\' 6"  (1.676 m), weight 223 lb 3.2 oz (101.2 kg), last menstrual period 10/12/2019. Pregravid weight 195 lb (88.5 kg) Total Weight Gain 28 lb 3.2 oz (12.8 kg) Urinalysis:      Fetal Status: Fetal Heart Rate (bpm): 140   Movement: Present  Presentation: Vertex  General:  Alert, oriented and cooperative. Patient is in no acute distress.  Skin: Skin is warm and dry. No rash noted.   Cardiovascular: Normal heart rate noted  Respiratory: Normal respiratory effort, no problems with respiration noted  Abdomen: Soft, gravid, appropriate for gestational age. Pain/Pressure: Absent     Pelvic:  Cervical exam performed Dilation: 1 Effacement (%): 50 Station: -3  Extremities: Normal range of motion.  Edema: None  Mental Status: Normal mood and affect. Normal  behavior. Normal judgment and thought content.   Membranes swept at maternal request  Assessment   29 y.o. G2P0010 at [redacted]w[redacted]d by  07/18/2020, by Last Menstrual Period presenting for routine prenatal visit  Plan   SECOND Problems (from 11/22/19 to present)    Problem Noted Resolved   Supervision of other normal pregnancy, antepartum 11/23/2019 by 11/25/2019, CNM No   Overview Addendum 06/27/2020  1:10 PM by 06/29/2020, CNM    [facility] Prenatal Labs  Dating LMP= 8US Blood type: AB/Positive/-- (11/23 1431)   Genetic Screen  NIPS: declines Antibody:Negative (11/23 1431)  Anatomic 10-30-1997 Normal female Rubella: 13.80 (11/23 1431) Varicella:Immune  GTT   Third trimester: 106 RPR: Non Reactive (11/23 1431)   Rhogam n/a HBsAg: Negative (11/23 1431)   TDaP vaccine   05/22/2020   Flu Shot:09/2019 HIV: Non Reactive (11/23 1431)   Baby Food  Breast GBS: negative  Contraception   Pap: 1/21; abn in past, needs repeat pp  CBB   No   CS/VBAC  N/A   Support Person  Husband          Previous Version   History of ectopic pregnancy 05/26/2019 by 05/28/2019, MD No      Covid testing Friday 07/21/2020 9-10:30am - In front of [facility], [address], Wear a mask. Induction Sunday 07/23/2020 5:00 am enter through [facility]   Gestational age appropriate obstetric precautions including but not limited to vaginal bleeding, contractions, leaking of fluid and fetal movement were reviewed in detail with the patient.    Return in about 1 week (around 07/24/2020), or if symptoms worsen or fail to improve.  07/26/2020 MD [facility], [facility] 07/17/2020, 9:53 AM

## 2020-07-18 ENCOUNTER — Inpatient Hospital Stay: Admit: 2020-07-18 | Payer: Self-pay

## 2020-07-21 ENCOUNTER — Observation Stay
Admission: EM | Admit: 2020-07-21 | Discharge: 2020-07-21 | Disposition: A | Discharge status: Home / Self Care | Payer: Managed Care, Other (non HMO) | Source: Home / Self Care | Admitting: Obstetrics and Gynecology

## 2020-07-21 ENCOUNTER — Encounter: Payer: Self-pay | Admitting: Obstetrics and Gynecology

## 2020-07-21 ENCOUNTER — Other Ambulatory Visit: Payer: Self-pay

## 2020-07-21 ENCOUNTER — Other Ambulatory Visit
Admission: RE | Admit: 2020-07-21 | Discharge: 2020-07-21 | Disposition: A | Discharge status: Home / Self Care | Payer: Managed Care, Other (non HMO) | Source: Ambulatory Visit | Attending: Obstetrics and Gynecology | Admitting: Obstetrics and Gynecology

## 2020-07-21 DIAGNOSIS — N898 Other specified noninflammatory disorders of vagina: Secondary | ICD-10-CM | POA: Insufficient documentation

## 2020-07-21 DIAGNOSIS — O99891 Other specified diseases and conditions complicating pregnancy: Secondary | ICD-10-CM | POA: Insufficient documentation

## 2020-07-21 DIAGNOSIS — Z20822 Contact with and (suspected) exposure to covid-19: Secondary | ICD-10-CM | POA: Insufficient documentation

## 2020-07-21 DIAGNOSIS — O0913 Supervision of pregnancy with history of ectopic or molar pregnancy, third trimester: Secondary | ICD-10-CM | POA: Insufficient documentation

## 2020-07-21 DIAGNOSIS — O99213 Obesity complicating pregnancy, third trimester: Secondary | ICD-10-CM | POA: Insufficient documentation

## 2020-07-21 DIAGNOSIS — Z8759 Personal history of other complications of pregnancy, childbirth and the puerperium: Secondary | ICD-10-CM

## 2020-07-21 DIAGNOSIS — Z3480 Encounter for supervision of other normal pregnancy, unspecified trimester: Secondary | ICD-10-CM

## 2020-07-21 DIAGNOSIS — Z3A40 40 weeks gestation of pregnancy: Secondary | ICD-10-CM | POA: Insufficient documentation

## 2020-07-21 LAB — SARS CORONAVIRUS 2 (TAT 6-24 HRS): SARS Coronavirus 2: NEGATIVE

## 2020-07-21 LAB — RUPTURE OF MEMBRANE (ROM)PLUS: Rom Plus: NEGATIVE

## 2020-07-21 NOTE — OB Triage Note (Signed)
Patient given discharged instructions and induction reviewed with patient. Patient verbalized understanding.

## 2020-07-21 NOTE — OB Triage Note (Signed)
Patient arrives with complaints of contractions  That started aroun 11pm last night  3-71min apart and possible LOF that started around 1330, small amount of clear fluid.  Patient stats baby is moving well. Pt eating well but has been nauseated and has had 3 BMs today.  Pt denies any other concerns. monitors applied and assessing.

## 2020-07-21 NOTE — Progress Notes (Signed)
OB History & Physical   History of Present Illness:  Chief Complaint: Mikael presents to [facility] with c/o a small amount of vaginal discharge and leakage she noticed this afternoon. She is also having mild, irregular contractions.  She called the [facility] office this afternoon and was instructed to go to the hospital for a labor check and to test for possible ROM. Her babyis moving well. Inger can feel her contractions, athough the discomfort is primarily in her back. She deneis any vaginal bleeding and did not wear a pad to the hospital. She denies any active leaking of fluid at this time. She ahs a scheduled IOL set for two days from today.  HPI:  Schelly Chuba is a 29 y.o. G2P0010 female at [redacted]w[redacted]d dated by LMP and confirmed via ultrasound.  Her pregnancy has been uncomplicated.  She presents to L&D for evaluation of possible SROM.   She admits to irregular  Contractions.   She REPORTS some scant fluid.   She denies Vaginal Bleeding.   She reports good Fetal movement.   Prenatal care site: Prenatal care at [facility] has been remarkable for the following risks : Obesity, hx of anxiety and migraines. Her first pregnancy was an ectopic.      Maternal Medical History:   Past Medical History:  Diagnosis Date  . Anxiety   . Ectopic pregnancy 02/2019   Ruptured right ectopic-had lap salpingostomy  . Migraine   . Ovarian cyst     Past Surgical History:  Procedure Laterality Date  . arthroscopic knee surgery (Right knee)     right knee  . LAPAROSCOPY N/A 03/21/2019   Procedure: LAPAROSCOPY DIAGNOSTIC, right sapingostomy;  Surgeon: Nadara Mustard, MD;  Location: [facility] ORS;  Service: Gynecology;  Laterality: N/A;  . MYRINGOTOMY Bilateral     Allergies  Allergen Reactions  . Cefixime Rash    Prior to Admission medications   Medication Sig Start Date End Date Taking? Authorizing Provider  Prenatal 28-0.8 MG TABS Take 1 tablet by mouth daily.   Yes [provider]   acetaminophen (TYLENOL) 325 MG tablet Take 650 mg by mouth every 6 (six) hours as needed (pain). Patient not taking: Reported on 07/21/2020    [provider]          Social History: She  reports that she has never smoked. She has never used smokeless tobacco. She reports previous alcohol use. She reports that she does not use drugs.  Family History: family history includes Breast cancer (age of onset: 69) in her mother; Heart disease in her paternal grandmother; Lung cancer in her maternal grandmother.   Review of Systems: Negative x 10 systems reviewed except as noted in the HPI.      Physical Exam:  Vital Signs: Temp 98.1 F (36.7 C) (Oral)   Ht 5\' 6"  (1.676 m)   Wt (!) 101.2 kg   LMP 10/12/2019 (Exact Date)   BMI 36.03 kg/m  General: no acute distress.  HEENT: normocephalic, atraumatic Heart: regular rate & rhythm.  No murmurs/rubs/gallops Lungs: clear to auscultation bilaterally Abdomen: soft, gravid, non-tender;  EFW: & 5 West Progression Recent Vital Signs   Temp 98.1 F (36.7 C) (Oral)   Ht 5\' 6"  (1.676 m)   Wt (!) 101.2 kg   LMP 10/12/2019 (Exact Date)   BMI 36.03 kg/m    Past Medical History:  Diagnosis Date  . Anxiety   . Ectopic pregnancy 02/2019   Ruptured right ectopic-had lap salpingostomy  .  Migraine   . Ovarian cyst      Expected Discharge Date     Diet Order            Diet clear liquid Room service appropriate? Yes; Fluid consistency: Thin  Diet effective now                  VTE Documentation      Work Intensity Score/Level of Care       Mobility        Significant Events    DC Barriers   Abnormal Labs:  Mirna Mires 07/21/2020, 5:40 PM  Pelvic:   External: Normal external female genitalia  Cervix: Dilation: 2 / Effacement (%): 80 / Station: -3   ROM: negative pooling, negative fern, Rom Plus is pending xtremities: non-tender, symmetric, no  edema bilaterally.  DTRs: 2+  Neurologic: Alert &  oriented x 3.    Pertinent Results:  Prenatal Labs: Blood type/Rh AB positive  Antibody screen negative  Rubella immune  RPR NR  HBsAg neg  HIV neg  GC neg  Chlamydia neg  Genetic screening declined  1 hour GTT WNL  3 hour GTT na  GBS neg    Baseline FHR: 145 beats    Variability: moderate     Accelerations: present    Decelerations: absent Contractions: present, mild, irregular  Overall assessment: category 1   Assessment:  Shaylee Stanislawski is a 29 y.o. G13P0010 female at [redacted]w[redacted]d with some vaginal discharge for SROM check Reactive FHTs.   Plan:  1. EFM while awaiting the result of the ROM plus. 2. Report given to Dr. Jean Rosenthal. 3. Will plan on discharge should her ROM Plus result be negative. 4. Follow up for her IOL  On Sunday.  Mirna Mires  07/21/2020 5:33 PM

## 2020-07-21 NOTE — Discharge Summary (Signed)
Physician Final Progress Note  Patient ID: Megan Newman MRN: [id number] DOB/AGE: 05/03/1991 29 y.o.  Admit date: 07/21/2020 Admitting provider: Conard Novak, MD Discharge date: 07/21/2020   Admission Diagnoses:  1) intrauterine pregnancy at [redacted]w[redacted]d  2) concern for rupture of membranes 3) abdominal pain in pregnancy, third trimester, concern for labor  Discharge Diagnoses:  1) intrauterine pregnancy at [redacted]w[redacted]d  2) concern for rupture of membranes - no evidence of rupture of membranes 3) abdominal pain in pregnancy, third trimester, concern for labor - no evidence of labor (false labor)  History of Present Illness: See progress note from [facility] for HPI.    Past Medical History:  Diagnosis Date  . Anxiety   . Ectopic pregnancy 02/2019   Ruptured right ectopic-had lap salpingostomy  . Migraine   . Ovarian cyst     Past Surgical History:  Procedure Laterality Date  . arthroscopic knee surgery (Right knee)     right knee  . LAPAROSCOPY N/A 03/21/2019   Procedure: LAPAROSCOPY DIAGNOSTIC, right sapingostomy;  Surgeon: Nadara Mustard, MD;  Location: [facility] ORS;  Service: Gynecology;  Laterality: N/A;  . MYRINGOTOMY Bilateral     No current facility-administered medications on file prior to encounter.   Current Outpatient Medications on File Prior to Encounter  Medication Sig Dispense Refill  . Prenatal 28-0.8 MG TABS Take 1 tablet by mouth daily.    Marland Kitchen acetaminophen (TYLENOL) 325 MG tablet Take 650 mg by mouth every 6 (six) hours as needed (pain). (Patient not taking: Reported on 07/21/2020)      Allergies  Allergen Reactions  . Cefixime Rash    Social History   Socioeconomic History  . Marital status: Married    Spouse name: Merton Border "Shaun"  . Number of children: Not on file  . Years of education: 65  . Highest education level: Not on file  Occupational History  . Occupation: Occupational hygienist  Tobacco Use  . Smoking status: Never Smoker  . Smokeless tobacco:  Never Used  Vaping Use  . Vaping Use: Never used  Substance and Sexual Activity  . Alcohol use: Not Currently    Comment: occassional  . Drug use: No  . Sexual activity: Yes    Birth control/protection: None  Other Topics Concern  . Not on file  Social History Narrative  . Not on file   Social Determinants of Health   Financial Resource Strain:   . Difficulty of Paying Living Expenses:   Food Insecurity:   . Worried About Programme researcher, broadcasting/film/video in the Last Year:   . Barista in the Last Year:   Transportation Needs:   . Freight forwarder (Medical):   Marland Kitchen Lack of Transportation (Non-Medical):   Physical Activity:   . Days of Exercise per Week:   . Minutes of Exercise per Session:   Stress:   . Feeling of Stress :   Social Connections:   . Frequency of Communication with Friends and Family:   . Frequency of Social Gatherings with Friends and Family:   . Attends Religious Services:   . Active Member of Clubs or Organizations:   . Attends Banker Meetings:   Marland Kitchen Marital Status:   Intimate Partner Violence:   . Fear of Current or Ex-Partner:   . Emotionally Abused:   Marland Kitchen Physically Abused:   . Sexually Abused:     Family History  Problem Relation Age of Onset  . Breast cancer Mother 71  . Lung  cancer Maternal Grandmother   . Heart disease Paternal Grandmother      Review of Systems  Constitutional: Negative.   HENT: Negative.   Eyes: Negative.   Respiratory: Negative.   Cardiovascular: Negative.   Gastrointestinal: Negative.   Genitourinary: Negative.   Musculoskeletal: Negative.   Skin: Negative.   Neurological: Negative.   Psychiatric/Behavioral: Negative.      Physical Exam: BP (!) 130/78 (BP Location: Left Arm)   Pulse 99   Temp 98.1 F (36.7 C) (Oral)   Resp 16   Ht 5\' 6"  (1.676 m)   Wt (!) 101.2 kg   LMP 10/12/2019 (Exact Date)   BMI 36.03 kg/m   OBGyn Exam  See physical exam from 10/14/2019.   Consults: None  Significant  Findings/ Diagnostic Studies:  Negative pooling, ferning, nitrazine  Per CNM Fryer ROM+ negative  Procedures: NST Baseline FHR: 145 beats/min Variability: moderate Accelerations: present Decelerations: absent Tocometry: infrequent, irregular  Interpretation:  INDICATIONS: rule out uterine contractions RESULTS:  A NST procedure was performed with FHR monitoring and a normal baseline established, appropriate time of 20-40 minutes of evaluation, and accels >2 seen w 15x15 characteristics.  Results show a REACTIVE NST.    Hospital Course: The patient was admitted to Labor and Delivery Triage for observation. She had no evidence of labor nor rupture of membranes. The fetal tracing was reassuring. She had normal vital signs. She was discharged and has an induction scheduled for less than 36 hours from now.   Discharge Condition: stable  Disposition: Discharge disposition: 01-Home or Self Care       Diet: Regular diet  Discharge Activity: Activity as tolerated   Allergies as of 07/21/2020      Reactions   Cefixime Rash      Medication List    STOP taking these medications   acetaminophen 325 MG tablet Commonly known as: TYLENOL     TAKE these medications   Prenatal 28-0.8 MG Tabs Take 1 tablet by mouth daily.         Signed: 07/23/2020, MD  07/21/2020, 6:12 PM

## 2020-07-22 ENCOUNTER — Inpatient Hospital Stay: Payer: Managed Care, Other (non HMO) | Admitting: Anesthesiology

## 2020-07-22 ENCOUNTER — Encounter: Payer: Self-pay | Admitting: Obstetrics and Gynecology

## 2020-07-22 ENCOUNTER — Inpatient Hospital Stay
Admission: EM | Admit: 2020-07-22 | Discharge: 2020-07-24 | DRG: 806 | Disposition: A | Discharge status: Home / Self Care | Payer: Managed Care, Other (non HMO) | Attending: Obstetrics and Gynecology | Admitting: Obstetrics and Gynecology

## 2020-07-22 ENCOUNTER — Other Ambulatory Visit: Payer: Self-pay

## 2020-07-22 DIAGNOSIS — Z3A40 40 weeks gestation of pregnancy: Secondary | ICD-10-CM | POA: Diagnosis not present

## 2020-07-22 DIAGNOSIS — Z3490 Encounter for supervision of normal pregnancy, unspecified, unspecified trimester: Secondary | ICD-10-CM

## 2020-07-22 DIAGNOSIS — D62 Acute posthemorrhagic anemia: Secondary | ICD-10-CM | POA: Diagnosis not present

## 2020-07-22 DIAGNOSIS — O9081 Anemia of the puerperium: Secondary | ICD-10-CM | POA: Diagnosis not present

## 2020-07-22 DIAGNOSIS — Z3480 Encounter for supervision of other normal pregnancy, unspecified trimester: Secondary | ICD-10-CM

## 2020-07-22 DIAGNOSIS — Z20822 Contact with and (suspected) exposure to covid-19: Secondary | ICD-10-CM | POA: Diagnosis present

## 2020-07-22 DIAGNOSIS — O26893 Other specified pregnancy related conditions, third trimester: Secondary | ICD-10-CM | POA: Diagnosis present

## 2020-07-22 DIAGNOSIS — Z8759 Personal history of other complications of pregnancy, childbirth and the puerperium: Secondary | ICD-10-CM

## 2020-07-22 LAB — TYPE AND SCREEN
ABO/RH(D): AB POS
Antibody Screen: NEGATIVE

## 2020-07-22 LAB — CBC
HCT: 33.5 % — ABNORMAL LOW (ref 36.0–46.0)
Hemoglobin: 11.7 g/dL — ABNORMAL LOW (ref 12.0–15.0)
MCH: 29.5 pg (ref 26.0–34.0)
MCHC: 34.9 g/dL (ref 30.0–36.0)
MCV: 84.4 fL (ref 80.0–100.0)
Platelets: 278 10*3/uL (ref 150–400)
RBC: 3.97 MIL/uL (ref 3.87–5.11)
RDW: 13.5 % (ref 11.5–15.5)
WBC: 19.2 10*3/uL — ABNORMAL HIGH (ref 4.0–10.5)
nRBC: 0 % (ref 0.0–0.2)

## 2020-07-22 MED ORDER — IBUPROFEN 600 MG PO TABS
ORAL_TABLET | ORAL | Status: AC
  Filled 2020-07-22: qty 1

## 2020-07-22 MED ORDER — OXYTOCIN-SODIUM CHLORIDE 30-0.9 UT/500ML-% IV SOLN
2.5000 [IU]/h | INTRAVENOUS | Status: DC
  Administered 2020-07-22: 2.5 [IU]/h via INTRAVENOUS
  Filled 2020-07-22 (×2): qty 500

## 2020-07-22 MED ORDER — ZOLPIDEM TARTRATE 5 MG PO TABS: 5.0000 mg | ORAL_TABLET | Freq: Every evening | ORAL | Status: DC | PRN

## 2020-07-22 MED ORDER — LIDOCAINE HCL (PF) 1 % IJ SOLN
INTRAMUSCULAR | Status: DC | PRN
  Administered 2020-07-22: 1.5 mL via SUBCUTANEOUS

## 2020-07-22 MED ORDER — IBUPROFEN 600 MG PO TABS
600.0000 mg | ORAL_TABLET | Freq: Four times a day (QID) | ORAL | Status: DC
  Administered 2020-07-22 – 2020-07-24 (×6): 600 mg via ORAL
  Filled 2020-07-22 (×5): qty 1

## 2020-07-22 MED ORDER — BENZOCAINE-MENTHOL 20-0.5 % EX AERO
1.0000 "application " | INHALATION_SPRAY | CUTANEOUS | Status: DC | PRN
  Administered 2020-07-22: 1 via TOPICAL
  Filled 2020-07-22: qty 56

## 2020-07-22 MED ORDER — LIDOCAINE-EPINEPHRINE (PF) 1.5 %-1:200000 IJ SOLN
INTRAMUSCULAR | Status: DC | PRN
  Administered 2020-07-22: 3 mL via PERINEURAL

## 2020-07-22 MED ORDER — FENTANYL 2.5 MCG/ML W/ROPIVACAINE 0.15% IN NS 100 ML EPIDURAL (ARMC)
12.0000 mL/h | EPIDURAL | Status: DC
  Administered 2020-07-22: 12 mL/h via EPIDURAL
  Filled 2020-07-22: qty 100

## 2020-07-22 MED ORDER — DIPHENHYDRAMINE HCL 25 MG PO CAPS: 25.0000 mg | ORAL_CAPSULE | Freq: Four times a day (QID) | ORAL | Status: DC | PRN

## 2020-07-22 MED ORDER — ACETAMINOPHEN 325 MG PO TABS: 650.0000 mg | ORAL_TABLET | ORAL | Status: DC | PRN

## 2020-07-22 MED ORDER — FENTANYL 2.5 MCG/ML W/ROPIVACAINE 0.15% IN NS 100 ML EPIDURAL (ARMC)
EPIDURAL | Status: AC
  Filled 2020-07-22: qty 100

## 2020-07-22 MED ORDER — WITCH HAZEL-GLYCERIN EX PADS: 1.0000 "application " | MEDICATED_PAD | CUTANEOUS | Status: DC | PRN

## 2020-07-22 MED ORDER — SOD CITRATE-CITRIC ACID 500-334 MG/5ML PO SOLN: 30.0000 mL | ORAL | Status: DC | PRN

## 2020-07-22 MED ORDER — DIPHENHYDRAMINE HCL 50 MG/ML IJ SOLN: 12.5000 mg | INTRAMUSCULAR | Status: DC | PRN

## 2020-07-22 MED ORDER — ACETAMINOPHEN 500 MG PO TABS
1000.0000 mg | ORAL_TABLET | Freq: Four times a day (QID) | ORAL | Status: DC | PRN
  Administered 2020-07-22: 1000 mg via ORAL
  Filled 2020-07-22: qty 2

## 2020-07-22 MED ORDER — ONDANSETRON HCL 4 MG PO TABS
4.0000 mg | ORAL_TABLET | ORAL | Status: DC | PRN
  Administered 2020-07-24: 4 mg via ORAL
  Filled 2020-07-22: qty 1

## 2020-07-22 MED ORDER — BUTORPHANOL TARTRATE 1 MG/ML IJ SOLN
INTRAMUSCULAR | Status: AC
  Filled 2020-07-22: qty 1

## 2020-07-22 MED ORDER — PRENATAL MULTIVITAMIN CH
1.0000 | ORAL_TABLET | Freq: Every day | ORAL | Status: DC
  Administered 2020-07-23: 1 via ORAL
  Filled 2020-07-22: qty 1

## 2020-07-22 MED ORDER — EPHEDRINE 5 MG/ML INJ
10.0000 mg | INTRAVENOUS | Status: DC | PRN
  Filled 2020-07-22: qty 2

## 2020-07-22 MED ORDER — OXYTOCIN-SODIUM CHLORIDE 30-0.9 UT/500ML-% IV SOLN
1.0000 m[IU]/min | INTRAVENOUS | Status: DC
  Administered 2020-07-22: 2 m[IU]/min via INTRAVENOUS

## 2020-07-22 MED ORDER — LACTATED RINGERS IV SOLN
500.0000 mL | Freq: Once | INTRAVENOUS | Status: AC
  Administered 2020-07-22 (×2): 500 mL via INTRAVENOUS

## 2020-07-22 MED ORDER — HYDROMORPHONE HCL 1 MG/ML IJ SOLN
0.5000 mg | INTRAMUSCULAR | Status: AC
  Administered 2020-07-22: 0.5 mg via INTRAVENOUS
  Filled 2020-07-22: qty 1

## 2020-07-22 MED ORDER — DOCUSATE SODIUM 100 MG PO CAPS
100.0000 mg | ORAL_CAPSULE | Freq: Two times a day (BID) | ORAL | Status: DC
  Administered 2020-07-22 – 2020-07-24 (×4): 100 mg via ORAL
  Filled 2020-07-22 (×4): qty 1

## 2020-07-22 MED ORDER — ONDANSETRON HCL 4 MG/2ML IJ SOLN: 4.0000 mg | INTRAMUSCULAR | Status: DC | PRN

## 2020-07-22 MED ORDER — LACTATED RINGERS IV SOLN
500.0000 mL | INTRAVENOUS | Status: DC | PRN
  Administered 2020-07-22: 1000 mL via INTRAVENOUS

## 2020-07-22 MED ORDER — COCONUT OIL OIL: 1.0000 "application " | TOPICAL_OIL | Status: DC | PRN

## 2020-07-22 MED ORDER — LIDOCAINE HCL (PF) 1 % IJ SOLN
30.0000 mL | INTRAMUSCULAR | Status: DC | PRN
  Filled 2020-07-22: qty 30

## 2020-07-22 MED ORDER — ACETAMINOPHEN 500 MG PO TABS
1000.0000 mg | ORAL_TABLET | Freq: Four times a day (QID) | ORAL | Status: DC
  Administered 2020-07-22 – 2020-07-24 (×5): 1000 mg via ORAL
  Filled 2020-07-22 (×5): qty 2

## 2020-07-22 MED ORDER — MISOPROSTOL 200 MCG PO TABS
ORAL_TABLET | ORAL | Status: AC
  Filled 2020-07-22: qty 4

## 2020-07-22 MED ORDER — LACTATED RINGERS IV SOLN: INTRAVENOUS | Status: DC

## 2020-07-22 MED ORDER — FENTANYL 2.5 MCG/ML W/ROPIVACAINE 0.15% IN NS 100 ML EPIDURAL (ARMC)
EPIDURAL | Status: DC | PRN
  Administered 2020-07-22: 12 mL/h via EPIDURAL

## 2020-07-22 MED ORDER — PHENYLEPHRINE 40 MCG/ML (10ML) SYRINGE FOR IV PUSH (FOR BLOOD PRESSURE SUPPORT)
80.0000 ug | PREFILLED_SYRINGE | INTRAVENOUS | Status: DC | PRN
  Filled 2020-07-22: qty 10

## 2020-07-22 MED ORDER — ONDANSETRON HCL 4 MG/2ML IJ SOLN: 4.0000 mg | Freq: Four times a day (QID) | INTRAMUSCULAR | Status: DC | PRN

## 2020-07-22 MED ORDER — OXYTOCIN BOLUS FROM INFUSION
333.0000 mL | Freq: Once | INTRAVENOUS | Status: AC
  Administered 2020-07-22: 333 mL via INTRAVENOUS

## 2020-07-22 MED ORDER — OXYCODONE HCL 5 MG PO TABS: 5.0000 mg | ORAL_TABLET | ORAL | Status: DC | PRN

## 2020-07-22 MED ORDER — DIBUCAINE (PERIANAL) 1 % EX OINT: 1.0000 "application " | TOPICAL_OINTMENT | CUTANEOUS | Status: DC | PRN

## 2020-07-22 MED ORDER — SIMETHICONE 80 MG PO CHEW
80.0000 mg | CHEWABLE_TABLET | ORAL | Status: DC | PRN
  Administered 2020-07-24: 80 mg via ORAL
  Filled 2020-07-22: qty 1

## 2020-07-22 MED ORDER — OXYCODONE HCL 5 MG PO TABS: 10.0000 mg | ORAL_TABLET | ORAL | Status: DC | PRN

## 2020-07-22 MED ORDER — BUTORPHANOL TARTRATE 1 MG/ML IJ SOLN
1.0000 mg | Freq: Once | INTRAMUSCULAR | Status: AC
  Administered 2020-07-22: 1 mg via INTRAVENOUS

## 2020-07-22 MED ORDER — TERBUTALINE SULFATE 1 MG/ML IJ SOLN: 0.2500 mg | Freq: Once | INTRAMUSCULAR | Status: DC | PRN

## 2020-07-22 NOTE — Discharge Summary (Signed)
Postpartum Discharge Summary  Date of Service updated 07/24/2020     Patient Name: Megan Newman DOB: 11-20-1991 MRN: [id number]  Date of admission: 07/22/2020 Delivery date:07/22/2020  Delivering provider: Adrian Prows R  Date of discharge: 07/24/2020  Admitting diagnosis: onset of labor Intrauterine pregnancy: [redacted]w[redacted]d    Secondary diagnosis:  none Additional problems: none    Discharge diagnosis: Term Pregnancy Delivered , Vacuum assisted vaginal delivery, arrest of descent, midline episiotomy, left sulcus laceration                                             Post partum procedures:none Augmentation: Pitocin Complications: None  Hospital course: Onset of Labor With Vaginal Delivery      29y.o. yo G2P0010 at [id number] admitted in Latent Labor on 07/22/2020. Patient had an uncomplicated first stage of labor course. Had an arrest of descent in second stage and vaginal delivery was assisted with a vacuum.  Membrane Rupture Time/Date: 10:56 AM ,07/22/2020   Delivery Method:Vaginal, Vacuum (Extractor)  Episiotomy: Median  Lacerations:  Left sulcus laceration Patient had an uncomplicated postpartum course.  She is ambulating, tolerating a regular diet, passing flatus, and urinating well. Patient is discharged home in stable condition on 07/24/20.  Newborn Data: Birth date:07/22/2020  Birth time:7:08 PM  Gender:Female [name] Living status:Living  Apgars:8 ,9  Weight:3770 g   Magnesium Sulfate received: No BMZ received: No Rhophylac:No MMR:No T-DaP:Given prenatally Flu: No Transfusion:No  Physical exam  Vitals:   07/23/20 2142 07/24/20 0031 07/24/20 0829 07/24/20 0835  BP: 112/70 110/66 111/78 111/66  Pulse: 88 81 80 80  Resp: '20 20 16 16  ' Temp: 98 F (36.7 C) 97.9 F (36.6 C) 97.9 F (36.6 C) 97.8 F (36.6 C)  TempSrc: Oral Oral Oral Oral  SpO2:  98% 99%   Weight:      Height:       General: alert, cooperative and no distress Lochia:  appropriate Uterine Fundus: firm/ U-1/ML/NT Incision: perineum intact, no hematoma DVT Evaluation: No evidence of DVT seen on physical exam. Labs: Lab Results  Component Value Date   WBC 17.8 (H) 07/23/2020   HGB 9.8 (L) 07/23/2020   HCT 29.0 (L) 07/23/2020   MCV 85.5 07/23/2020   PLT 251 07/23/2020   CMP Latest Ref Rng & Units 10/08/2015  Glucose 65 - 99 mg/dL 100(H)  BUN 6 - 20 mg/dL 8  Creatinine 0.44 - 1.00 mg/dL 0.69  Sodium 135 - 145 mmol/L 136  Potassium 3.5 - 5.1 mmol/L 3.9  Chloride 101 - 111 mmol/L 107  CO2 22 - 32 mmol/L 21(L)  Calcium 8.9 - 10.3 mg/dL 9.0   Edinburgh Score: Edinburgh Postnatal Depression Scale Screening Tool 07/23/2020  I have been able to laugh and see the funny side of things. 0  I have looked forward with enjoyment to things. 0  I have blamed myself unnecessarily when things went wrong. 0  I have been anxious or worried for no good reason. 1  I have felt scared or panicky for no good reason. 1  Things have been getting on top of me. 0  I have been so unhappy that I have had difficulty sleeping. 1  I have felt sad or miserable. 0  I have been so unhappy that I have been crying. 0  The thought of harming myself has occurred  to me. 0  Edinburgh Postnatal Depression Scale Total 3      After visit meds:  Allergies as of 07/24/2020      Reactions   Cefixime Rash      Medication List    TAKE these medications   acetaminophen 500 MG tablet Commonly known as: TYLENOL Take 2 tablets (1,000 mg total) by mouth every 6 (six) hours as needed.   docusate sodium 100 MG capsule Commonly known as: COLACE Take 1 capsule (100 mg total) by mouth daily.   ferrous sulfate 325 (65 FE) MG tablet Commonly known as: FerrouSul Take 1 tablet (325 mg total) by mouth daily with breakfast.   ibuprofen 600 MG tablet Commonly known as: ADVIL Take 1 tablet (600 mg total) by mouth every 6 (six) hours as needed for mild pain, moderate pain or cramping.    Prenatal 28-0.8 MG Tabs Take 1 tablet by mouth daily.        Discharge home in stable condition Infant Feeding: Breast Infant Disposition:home with mother Discharge instruction: per After Visit Summary and Postpartum booklet. Activity: Advance as tolerated. Pelvic rest for 6 weeks.  Diet: routine diet Anticipated Birth Control: Unsure Postpartum Appointment:2 weeks Additional Postpartum F/U:  Future Appointments:No future appointments. Follow up Visit:  Follow-up Information    Schuman, Stefanie Libel, MD. Schedule an appointment as soon as possible for a visit in 2 week(s).   Specialty: Obstetrics and Gynecology Contact information: [address] [place] [address] [phone]                   07/24/2020 Dalia Heading, CNM

## 2020-07-22 NOTE — Discharge Instructions (Signed)
Discharge Instructions:   Follow-up Appointment: 1-2 weeks, please call the office to schedule  If there are any new medications, they have been ordered and will be available for pickup at the listed pharmacy on your way home from the hospital.   Call the office if you have any of the following: headache, visual changes, fever >101.0 F, chills, shortness of breath, breast concerns, excessive vaginal bleeding, incision drainage or problems, leg pain or redness, depression or any other concerns. If you have vaginal discharge with an odor, let your doctor know.   It is normal to bleed for up to 6 weeks. You should not soak through more than 1 pad in 1 hour. If you have a blood clot larger than your fist with continued bleeding, call your doctor.   Activity: Do not lift > 15 lbs for 6 weeks (do not lift anything heavier than your baby). No intercourse, tampons, swimming pools, hot tubs, baths (only showers) for 6 weeks.  No driving for 1-2 weeks. Do not drive while taking narcotic or opioid pain medication.  Continue taking your prenatal vitamin, especially if breastfeeding. Increase calories and fluids (water) while breastfeeding.   Your milk will come in, in the next couple of days (right now it is colostrum). You may have a slight fever when your milk comes in, but it should go away on its own.  If it does not, and rises above 101 F please call the doctor. You will also feel achy and your breasts will be firm. They will also start to leak. If you are breastfeeding, continue as you have been and you can pump/express milk for comfort.   If you have too much milk, your breasts can become engorged, which could lead to mastitis. This is an infection of the milk ducts. It can be very painful and you will need to notify your doctor to obtain a prescription for antibiotics. You can also treat it with a shower or hot/cold compress.   For concerns about your baby, please call your pediatrician.  For  breastfeeding concerns, the lactation consultant can be reached at [phone].   Postpartum blues (feelings of happy one minute and sad another minute) are normal for the first few weeks but if it gets worse let your doctor know.   Congratulations! We enjoyed caring for you and your new bundle of joy!

## 2020-07-22 NOTE — Anesthesia Procedure Notes (Signed)
Epidural Patient location during procedure: OB Start time: 07/22/2020 10:25 AM End time: 07/22/2020 10:48 AM  Staffing Performed: anesthesiologist   Preanesthetic Checklist Completed: patient identified, IV checked, site marked, risks and benefits discussed, surgical consent, monitors and equipment checked, pre-op evaluation and timeout performed  Epidural Patient position: sitting Prep: Betadine Patient monitoring: heart rate, continuous pulse ox and blood pressure Approach: midline Location: L4-L5 Injection technique: LOR saline  Needle:  Needle type: Tuohy  Needle gauge: 17 G Needle length: 9 cm and 9 Needle insertion depth: 7 cm Catheter type: closed end flexible Catheter size: 20 Guage Catheter at skin depth: 13 cm Test dose: negative and 1.5% lidocaine with Epi 1:200 K  Assessment Events: blood not aspirated, injection not painful, no injection resistance, no paresthesia and negative IV test  Additional Notes   Patient tolerated the insertion well without complications.Reason for block:procedure for pain

## 2020-07-22 NOTE — H&P (Signed)
OB History & Physical   History of Present Illness:  Chief Complaint: contractions  HPI:  Megan Newman is a 29 y.o. G2P0010 female at [redacted]w[redacted]d dated by LMP consistent with an 8 week ultrasound.  Her pregnancy has been uncomplicated.    She reports contractions.   She denies leakage of fluid.   She denies vaginal bleeding.   She reports fetal movement.    Total weight gain for pregnancy: 11.3 kg   Obstetrical Problem List: SECOND Problems (from 11/22/19 to present)    Problem Noted Resolved   Supervision of other normal pregnancy, antepartum 11/23/2019 by Farrel Conners, CNM No   Overview Addendum 06/27/2020  1:10 PM by Farrel Conners, CNM    Clinic [facility] Prenatal Labs  Dating LMP= 8US Blood type: AB/Positive/-- (11/23 1431)   Genetic Screen  NIPS: declines Antibody:Negative (11/23 1431)  Anatomic [place] Normal female Rubella: 13.80 (11/23 1431) Varicella:Immune  GTT   Third trimester: 106 RPR: Non Reactive (11/23 1431)   Rhogam n/a HBsAg: Negative (11/23 1431)   TDaP vaccine   05/22/2020   Flu Shot:09/2019 HIV: Non Reactive (11/23 1431)   Baby Food  Breast GBS: negative  Contraception   Pap: 1/21; abn in past, needs repeat pp  CBB   No   CS/VBAC  N/A   Support Person  Husband          Previous Version   History of ectopic pregnancy 05/26/2019 by Nadara Mustard, MD No       Maternal Medical History:   Past Medical History:  Diagnosis Date  . Anxiety   . Ectopic pregnancy 02/2019   Ruptured right ectopic-had lap salpingostomy  . Migraine   . Ovarian cyst     Past Surgical History:  Procedure Laterality Date  . arthroscopic knee surgery (Right knee)     right knee  . LAPAROSCOPY N/A 03/21/2019   Procedure: LAPAROSCOPY DIAGNOSTIC, right sapingostomy;  Surgeon: Nadara Mustard, MD;  Location: [facility] ORS;  Service: Gynecology;  Laterality: N/A;  . MYRINGOTOMY Bilateral     Allergies  Allergen Reactions  . Cefixime Rash    Prior to Admission  medications   Medication Sig Start Date End Date Taking? Authorizing Provider  Prenatal 28-0.8 MG TABS Take 1 tablet by mouth daily.   Yes [provider]    OB History  Gravida Para Term Preterm AB Living  2       1    SAB TAB Ectopic Multiple Live Births      1        # Outcome Date GA Lbr Len/2nd Weight Sex Delivery Anes PTL Lv  2 Current           1 Ectopic 03/21/19     ECTOPIC       Prenatal care site: [facility]  Social History: She  reports that she has never smoked. She has never used smokeless tobacco. She reports previous alcohol use. She reports that she does not use drugs.  Family History: family history includes Breast cancer (age of onset: 27) in her mother; Heart disease in her paternal grandmother; Lung cancer in her maternal grandmother.   Review of Systems:  Review of Systems  Constitutional: Negative.   HENT: Negative.   Eyes: Negative.   Respiratory: Negative.   Cardiovascular: Negative.   Gastrointestinal: Negative.   Genitourinary: Negative.   Musculoskeletal: Negative.   Skin: Negative.   Neurological: Negative.   Psychiatric/Behavioral: Negative.      Physical  Exam:  BP 107/74   Pulse 98   Temp 98.5 F (36.9 C) (Oral)   Resp 18   Ht 5\' 6"  (1.676 m)   Wt (!) 99.8 kg   LMP 10/12/2019 (Exact Date)   BMI 35.51 kg/m   Physical Exam Constitutional:      General: She is not in acute distress.    Appearance: Normal appearance. She is well-developed.  Genitourinary:     Genitourinary Comments: 4 cm per RN (change from 2.5 cm)  HENT:     Head: Normocephalic and atraumatic.  Eyes:     General: No scleral icterus.    Conjunctiva/sclera: Conjunctivae normal.  Cardiovascular:     Rate and Rhythm: Normal rate and regular rhythm.     Heart sounds: No murmur heard.  No friction rub. No gallop.   Pulmonary:     Effort: Pulmonary effort is normal. No respiratory distress.     Breath sounds: Normal breath sounds. No wheezing or rales.   Abdominal:     General: Bowel sounds are normal. There is no distension.     Palpations: Abdomen is soft.     Tenderness: There is no abdominal tenderness. There is no guarding or rebound.     Comments: Gravid, NT  Musculoskeletal:        General: Normal range of motion.     Cervical back: Normal range of motion and neck supple.  Neurological:     General: No focal deficit present.     Mental Status: She is alert and oriented to person, place, and time.     Cranial Nerves: No cranial nerve deficit.  Skin:    General: Skin is warm and dry.     Findings: No erythema.  Psychiatric:        Mood and Affect: Mood normal.        Behavior: Behavior normal.        Judgment: Judgment normal.     Baseline FHR: 140 beats/min   Variability: moderate   Accelerations: present   Decelerations: present (early decelerations) Contractions: present frequency: irregular 2-3 q 10 min Overall assessment: cat 1  Lab Results  Component Value Date   SARSCOV2NAA NEGATIVE 07/21/2020    Assessment:  Megan Newman is a 29 y.o. G75P0010 female at [redacted]w[redacted]d with active normal (normal labor).   Plan:  1. Admit to Labor & Delivery  2. CBC, T&S, Clrs, IVF 3. GBS negative.   4. Fetwal well-being: reassuring overall.  Monitor early decelerations with lower dilation.  5. Expectant management.   [redacted]w[redacted]d, MD 07/22/2020 7:53 AM

## 2020-07-22 NOTE — OB Triage Note (Signed)
Recvd pt from ED. Pt states contractions started around 11:00 pm yesterday. States they are 3 min apart. No LOF or vaginal bleeding. Positive fetal movement. Rates pain a 8 out of 10.

## 2020-07-22 NOTE — Progress Notes (Signed)
Megan Newman is a 29 y.o. G2P0010 at [redacted]w[redacted]d by ultrasound admitted for regular contractions.  Subjective: She has been resting  To allow a period of "rest and descent". Comfortable. Using the "peanut ball" to facilitate internal rotation, as the baby has been OP.  Feeling some rectal pressure  Objective: BP (!) 107/45   Pulse 70   Temp 97.9 F (36.6 C) (Oral)   Resp 18   Ht 5\' 6"  (1.676 m)   Wt (!) 99.8 kg   LMP 10/12/2019 (Exact Date)   SpO2 100%   BMI 35.51 kg/m  No intake/output data recorded. Total I/O In: -  Out: 250 [Urine:250]  FHT:  FHR: 125 bpm, variability: moderate,  accelerations:  Present,  decelerations:  Present non reptetive early decels and occasional variable noted UC:   regular, every 4-5 minutes SVE:   Dilation: anterior lip Effacement (%): 100 Station: -1 OP Exam by::M 10/14/2019 CNM Labs: Lab Results  Component Value Date   WBC 19.2 (H) 07/22/2020   HGB 11.7 (L) 07/22/2020   HCT 33.5 (L) 07/22/2020   MCV 84.4 07/22/2020   PLT 278 07/22/2020    Assessment / Plan: Protracted active phase  Labor: has been off pitocin for last hour. will restart pitocin to augment  Fetal Wellbeing:  Category II Pain Control:  Epidural I/D:  n/a Anticipated MOD:  NSVD  Plan to recheck once contraction s are more frequent.  07/24/2020 07/22/2020, 3:41 PM

## 2020-07-22 NOTE — Progress Notes (Signed)
Megan Newman is a 29 y.o. G2P0010 at [redacted]w[redacted]d by ultrasound admitted for regular contractions at term.  Subjective  She is sitting up in the process of getting an epidural. Has received earlier, a dose of Dilaudid.  Objective: BP 116/65   Pulse 79   Temp 98.5 F (36.9 C) (Oral)   Resp 18   Ht 5\' 6"  (1.676 m)   Wt (!) 99.8 kg   LMP 10/12/2019 (Exact Date)   BMI 35.51 kg/m  No intake/output data recorded. No intake/output data recorded.  FHT:  FHR: 140 bpm, variability: moderate,  accelerations:  Present,  decelerations:  Present some early decels noted with contractions UC:   irregular, every 6  minutes SVE:   Dilation: 4 Effacement (%): 80, 90 Station: -3 Exam by:: MBS  Labs: Lab Results  Component Value Date   WBC 19.2 (H) 07/22/2020   HGB 11.7 (L) 07/22/2020   HCT 33.5 (L) 07/22/2020   MCV 84.4 07/22/2020   PLT 278 07/22/2020    Assessment / Plan: Protracted latent phase. Was seen for labor evaluation yesterday and sent home. Now admitted and having irregular contractions. Epidural placement in progress.  Labor: Will recheck after epidural placed and consider augmentation with pitocin.  Fetal Wellbeing:  Category II Pain Control:  Epidural I/D:  n/a Anticipated MOD:  NSVD  07/24/2020 07/22/2020, 10:33 AM

## 2020-07-22 NOTE — Anesthesia Preprocedure Evaluation (Signed)
Anesthesia Evaluation  Patient identified by MRN, date of birth, ID band Patient awake    Reviewed: Allergy & Precautions, NPO status , Patient's Chart, lab work & pertinent test results  History of Anesthesia Complications Negative for: history of anesthetic complications  Airway Mallampati: II       Dental   Pulmonary asthma (as a child, no inhalers for 2 years) , neg sleep apnea, neg COPD, Not current smoker,           Cardiovascular (-) hypertension(-) Past MI and (-) CHF (-) dysrhythmias (-) Valvular Problems/Murmurs     Neuro/Psych neg Seizures Anxiety    GI/Hepatic Neg liver ROS, GERD (gestational)  ,  Endo/Other  neg diabetes  Renal/GU negative Renal ROS     Musculoskeletal   Abdominal   Peds  Hematology   Anesthesia Other Findings   Reproductive/Obstetrics                             Anesthesia Physical Anesthesia Plan  ASA: II  Anesthesia Plan: Epidural   Post-op Pain Management:    Induction:   PONV Risk Score and Plan:   Airway Management Planned:   Additional Equipment:   Intra-op Plan:   Post-operative Plan:   Informed Consent: I have reviewed the patients History and Physical, chart, labs and discussed the procedure including the risks, benefits and alternatives for the proposed anesthesia with the patient or authorized representative who has indicated his/her understanding and acceptance.       Plan Discussed with:   Anesthesia Plan Comments:         Anesthesia Quick Evaluation

## 2020-07-22 NOTE — Progress Notes (Signed)
Megan Newman is a 29 y.o. G2P0010 at [redacted]w[redacted]d by ultrasound admitted for contractions  Subjective:  Now comfortable with epidural.  Objective: BP (!) 96/51   Pulse 97   Temp 98.5 F (36.9 C) (Oral)   Resp 18   Ht 5\' 6"  (1.676 m)   Wt (!) 99.8 kg   LMP 10/12/2019 (Exact Date)   SpO2 100%   BMI 35.51 kg/m  No intake/output data recorded. No intake/output data recorded.  FHT:  FHR: 140 bpm, variability: moderate,  accelerations:  Present,  decelerations:  Present early decels seen with contractions. UC:   irregular, every 2-5 minutes SVE:   Dilation: 7 Effacement (%): 90 Station: -2 Exam by:: MBS  Labs: Lab Results  Component Value Date   WBC 19.2 (H) 07/22/2020   HGB 11.7 (L) 07/22/2020   HCT 33.5 (L) 07/22/2020   MCV 84.4 07/22/2020   PLT 278 07/22/2020    Assessment / Plan: Spontaneous labor, progressing normally  Labor: Progressing normally  Fetal Wellbeing:  Category II Pain Control:  Epidural I/D:  n/a Anticipated MOD:  NSVD   AROM performed. Clear fluid seen.  Expectant management.  07/24/2020 07/22/2020, 11:02 AM

## 2020-07-22 NOTE — Progress Notes (Signed)
Megan Newman is a 29 y.o. G2P0010 at [redacted]w[redacted]d by ultrasound admitted for regular contractions at term.  Subjective: She has been resting for several hours with her epidural in place. Very comfortable. No desire to bear down; no pelvic pressure.  Objective: BP (!) 92/39   Pulse 73   Temp 98.5 F (36.9 C) (Oral)   Resp 18   Ht 5\' 6"  (1.676 m)   Wt (!) 99.8 kg   LMP 10/12/2019 (Exact Date)   SpO2 99%   BMI 35.51 kg/m  No intake/output data recorded. Total I/O In: -  Out: 250 [Urine:250]  FHT:  FHR: 135 bpm, variability: moderate,  accelerations:  Present,  decelerations:  Present occasional variables, early decels UC:   irregular, every 2-6 minutes  SVE:   Dilation: 7 Effacement (%): 80 Station: -2 Direct OP Exam by:: M.Brooklyn Jeff, CNM  Labs: Lab Results  Component Value Date   WBC 19.2 (H) 07/22/2020   HGB 11.7 (L) 07/22/2020   HCT 33.5 (L) 07/22/2020   MCV 84.4 07/22/2020   PLT 278 07/22/2020    Assessment / Plan: Protracted active phase  Cervix is less effaced  Labor: Irregular contractions- will augment with pitocin,   Fetal Wellbeing:  Category II Pain Control:  Epidural I/D:  n/a Anticipated MOD:  NSVD  Augmentation process explained to the patient carefully. Will also use "spinning Babies' techniques to encourage internal rotation.  07/24/2020 07/22/2020, 1:10 PM

## 2020-07-22 NOTE — Progress Notes (Signed)
Late entry for 14:08 SVE: 10/100/-1 Pitocin was discontinued for fetal heart rate decelerations Pushed with patient. Good pushing efforts. Variable deceleration.  Positioned with a peanut ball.  Will labor down and resume pushing. OP presentations suspected.   Adelene Idler MD, [facility], [facility] 07/22/2020 3:19 PM

## 2020-07-23 LAB — CBC
HCT: 29 % — ABNORMAL LOW (ref 36.0–46.0)
Hemoglobin: 9.8 g/dL — ABNORMAL LOW (ref 12.0–15.0)
MCH: 28.9 pg (ref 26.0–34.0)
MCHC: 33.8 g/dL (ref 30.0–36.0)
MCV: 85.5 fL (ref 80.0–100.0)
Platelets: 251 10*3/uL (ref 150–400)
RBC: 3.39 MIL/uL — ABNORMAL LOW (ref 3.87–5.11)
RDW: 13.7 % (ref 11.5–15.5)
WBC: 17.8 10*3/uL — ABNORMAL HIGH (ref 4.0–10.5)
nRBC: 0 % (ref 0.0–0.2)

## 2020-07-23 LAB — RPR: RPR Ser Ql: NONREACTIVE

## 2020-07-23 NOTE — Plan of Care (Signed)
Alert and oriented;pleasant affect. Assessment wnl. V/O  and demonstrates aprop. Self care. Denies c/o.

## 2020-07-23 NOTE — Lactation Note (Signed)
This note was copied from a baby's chart. Lactation Consultation Note  Patient Name: Megan Newman EUMPN'T Date: 07/23/2020 Reason for consult: Follow-up assessment;Mother's request;Primapara;Term;Other (Comment) (Wanting to get good BF before giving bath)  Lactation has been assisting mom with breast feeds.  Hand expression has been demonstrated, but have not been able to express more than 1 drop.  This is mom's second pregnancy, but first baby.  Earlier in the day mom was more comfortable in the cradle hold.  Later she became more comfortable and in control with keeping Megan Newman at the breast for longer periods with a deeper latch in the football hold.  Mom struggled to get Megan Newman to sustain the latch with her slipping off to the tip of the nipple and having to hold the breast through the entire feeding.  Some methods that improved the breast feeds were supporting the breast with a rolled wash cloth under the breast and showing FOB how to sandwich the breast until she got a deeper latch and started sucking stronger.  She has progressed with not having to hold the breast through the entire feeding now sustaining the latch on her own.  While mom was struggling to get a deeper latch and Megan Newman was on the tip of the nipple pinching, mom's nipples became sore.  Mom brought her own Lansinoh nipple butter which seems to have helped some.  Mom is still experiencing some pain at the beginning of the breast feed because nipples are red from previous trauma, but the pain eases up into the breast feed once she achieves deeper latch.  Handout given on what to expect with feeding the first few days of life and reviewed normal newborn stomach size, transient nipple tenderness, supply and demand, normal course of lactation and routine newborn feeding patterns.  Megan Newman has not voided or stooled since delivery, but did have large terminal mec at delivery.  Told mom to let us know when Megan Newman had a void or bowel movement.   Lactation Government social research officer given and reviewed.  Lactation name and number has been written on white board and encouraged to call with any questions, concerns or assistance. Maternal Data Formula Feeding for Exclusion: No Has patient been taught Hand Expression?: Yes Does the patient have breastfeeding experience prior to this delivery?: No (Gr2, but P1)  Feeding Feeding Type: Breast Fed  LATCH Score Latch: Repeated attempts needed to sustain latch, nipple held in mouth throughout feeding, stimulation needed to elicit sucking reflex.  Audible Swallowing: A few with stimulation  Type of Nipple: Everted at rest and after stimulation  Comfort (Breast/Nipple): Filling, red/small blisters or bruises, mild/mod discomfort  Hold (Positioning): Assistance needed to correctly position infant at breast and maintain latch.  LATCH Score: 6  Interventions Interventions: Breast feeding basics reviewed;Assisted with latch;Skin to skin;Breast massage;Hand express;Pre-pump if needed;Reverse pressure;Breast compression;Adjust position;Support pillows;Position options (Mom has her own Lansinoh nipple butter)  Lactation Tools Discussed/Used Tools:  (Mom has her own Lansinoh nipple butter) WIC Program: No Counselling psychologist)   Consult Status Consult Status: Follow-up Date: 07/23/20 Follow-up type: Call as needed    Louis Meckel 07/23/2020, 5:44 PM

## 2020-07-23 NOTE — Plan of Care (Signed)
Transferred to Room 350 PP. Alert and oriented with aprop. Affect. Oriented to Room, Fall Prevention and POC. V/O.

## 2020-07-23 NOTE — Progress Notes (Signed)
   Subjective:  Doing well postpartum day 1. She is tolerating regular diet. Her pain is controlled with PO medications. She is ambulating and voiding without difficulty. She reports her nipples are sore from breastfeeding. We reviewed the basics of latching to avoid sore/cracked nipples.  Objective:  Vital signs in last 24 hours: Temp:  [97.9 F (36.6 C)-99.7 F (37.6 C)] 98.6 F (37 C) (07/25 0759) Pulse Rate:  [59-158] 83 (07/25 0759) Resp:  [16-20] 18 (07/25 0759) BP: (81-121)/(38-79) 91/52 (07/25 0759) SpO2:  [96 %-100 %] 99 % (07/25 0759)    General: NAD Pulmonary: no increased work of breathing Abdomen: non-distended, non-tender, fundus firm at level of umbilicus Extremities: no edema, no erythema, no tenderness Genital: no evidence of hematoma, suturing well approximated, ice pack applied  Results for orders placed or performed during the hospital encounter of 07/22/20 (from the past 72 hour(s))  CBC     Status: Abnormal   Collection Time: 07/22/20  8:35 AM  Result Value Ref Range   WBC 19.2 (H) 4.0 - 10.5 K/uL   RBC 3.97 3.87 - 5.11 MIL/uL   Hemoglobin 11.7 (L) 12.0 - 15.0 g/dL   HCT 16.1 (L) 36 - 46 %   MCV 84.4 80.0 - 100.0 fL   MCH 29.5 26.0 - 34.0 pg   MCHC 34.9 30.0 - 36.0 g/dL   RDW 09.6 04.5 - 40.9 %   Platelets 278 150 - 400 K/uL   nRBC 0.0 0.0 - 0.2 %    Comment: Performed at [facility], [address] [place], [place] [address]  Type and screen [facility]     Status: None   Collection Time: 07/22/20  8:35 AM  Result Value Ref Range   ABO/RH(D) AB POS    Antibody Screen NEG    Sample Expiration      07/25/2020,2359 Performed at [facility], [address] [place], [place] [address]   CBC     Status: Abnormal   Collection Time: 07/23/20  6:34 AM  Result Value Ref Range   WBC 17.8 (H) 4.0 - 10.5 K/uL   RBC 3.39 (L) 3.87 - 5.11 MIL/uL   Hemoglobin 9.8 (L) 12.0 - 15.0 g/dL   HCT 56.2 (L) 36 - 46 %   MCV  85.5 80.0 - 100.0 fL   MCH 28.9 26.0 - 34.0 pg   MCHC 33.8 30.0 - 36.0 g/dL   RDW 13.0 86.5 - 78.4 %   Platelets 251 150 - 400 K/uL   nRBC 0.0 0.0 - 0.2 %    Comment: Performed at [facility], [address], [place], [place] [address]    Assessment:   29 y.o. G2P1011 postpartum day # 1, lactating  Plan:    1) Acute blood loss anemia - hemodynamically stable and asymptomatic - po ferrous sulfate  2) Blood Type --/--/AB POS (07/24 5284) / Rubella 13.80 (11/23 1431) / Varicella Immune  3) TDAP status up to date  4) Feeding plan breast, lactation support  5)  Education given regarding options for contraception, as well as compatibility with breast feeding if applicable.  Patient is undecided on method of contraception.  6) Disposition: continue current care   Tresea Mall, CNM [facility] 07/23/2020, 10:20 AM

## 2020-07-23 NOTE — Anesthesia Postprocedure Evaluation (Signed)
Anesthesia Post Note  Patient: Megan Newman  Procedure(s) Performed: AN AD HOC LABOR EPIDURAL  Patient location during evaluation: Mother Baby Anesthesia Type: Epidural Level of consciousness: awake and alert Pain management: pain level controlled Vital Signs Assessment: post-procedure vital signs reviewed and stable Respiratory status: spontaneous breathing, nonlabored ventilation and respiratory function stable Cardiovascular status: stable Postop Assessment: no headache, no backache and epidural receding Anesthetic complications: no   No complications documented.   Last Vitals:  Vitals:   07/23/20 0325 07/23/20 0759  BP: (!) 95/55 (!) 91/52  Pulse: 88 83  Resp: 20 18  Temp: 36.8 C 37 C  SpO2: 96% 99%    Last Pain:  Vitals:   07/23/20 0950  TempSrc:   PainSc: 0-No pain                 Arlicia Paquette K

## 2020-07-24 DIAGNOSIS — D62 Acute posthemorrhagic anemia: Secondary | ICD-10-CM | POA: Diagnosis not present

## 2020-07-24 MED ORDER — DOCUSATE SODIUM 100 MG PO CAPS: 100.0000 mg | ORAL_CAPSULE | Freq: Every day | ORAL | 1 refills | Status: AC

## 2020-07-24 MED ORDER — IBUPROFEN 600 MG PO TABS: 600.0000 mg | ORAL_TABLET | Freq: Four times a day (QID) | ORAL | 0 refills | Status: DC | PRN

## 2020-07-24 MED ORDER — FERROUS SULFATE 325 (65 FE) MG PO TABS: 325.0000 mg | ORAL_TABLET | Freq: Every day | ORAL | 1 refills | Status: DC

## 2020-07-24 MED ORDER — ACETAMINOPHEN 500 MG PO TABS: 1000.0000 mg | ORAL_TABLET | Freq: Four times a day (QID) | ORAL | 0 refills | Status: DC | PRN

## 2020-07-24 NOTE — Lactation Note (Signed)
This note was copied from a baby's chart. Lactation Consultation Note  Patient Name: Girl Leisl Spurrier [id number] Date: 07/24/2020   Observed mom latching [name] on her own.  Mom reports [name] is latching much better with #24 nipple shield.  Drop of colostrum was noted in nipple shield when came off the breast.  She was fussing some and coming on and off the breast several times during the breast feed.  Mom reports cramping with breast feed.  Explained that was a good sign and that the discomfort would decrease once uterus is back to pre pregnant state.  Still tender when feeding, but better than it was without nipple shield.  Nipple is rounded after she comes off the breast.  Without the nipple shield it was pinched when she came off the breast.  Mom declines coconut oil or comfort gels.  Mom reports having a pump that was given to her at a shower, but she plans to get Medela DEBP through her [employer].  Information given and process discussed on how to get DEBP through insurance.  Mom requested manual pump. DEBP kit given with instructions in how to use manual and how it attaches to electric pump.  Discussed collection, storage, cleaning and handling of expressed milk.  Reminded of and encouraged to call with any questions, concerns or assistance while in the hospital and/or after she is discharged.    Maternal Data    Feeding    LATCH Score                   Interventions    Lactation Tools Discussed/Used     Consult Status      Jarold Motto 07/24/2020, 4:45 PM

## 2020-07-24 NOTE — Progress Notes (Signed)
DC inst reviewed with pt.  Verb u/o of f/u care of self and f/u appointment.

## 2020-07-24 NOTE — Progress Notes (Signed)
D/C to home

## 2020-07-25 ENCOUNTER — Emergency Department: Payer: Managed Care, Other (non HMO)

## 2020-07-25 ENCOUNTER — Other Ambulatory Visit: Payer: Self-pay

## 2020-07-25 ENCOUNTER — Encounter: Payer: Self-pay | Admitting: Emergency Medicine

## 2020-07-25 DIAGNOSIS — O1205 Gestational edema, complicating the puerperium: Secondary | ICD-10-CM | POA: Diagnosis present

## 2020-07-25 NOTE — ED Triage Notes (Signed)
Patient ambulatory to triage with steady gait, without difficulty or distress noted; pt reports vag delivery on Saturday; reports rt lower leg/foot swelling for several hours; denies pain or any other accomp symptoms

## 2020-07-26 ENCOUNTER — Other Ambulatory Visit: Payer: Self-pay

## 2020-07-26 ENCOUNTER — Emergency Department: Payer: Managed Care, Other (non HMO)

## 2020-07-26 ENCOUNTER — Emergency Department
Admission: EM | Admit: 2020-07-26 | Discharge: 2020-07-26 | Disposition: A | Discharge status: Home / Self Care | Payer: Managed Care, Other (non HMO) | Attending: Emergency Medicine | Admitting: Emergency Medicine

## 2020-07-26 DIAGNOSIS — R609 Edema, unspecified: Secondary | ICD-10-CM

## 2020-07-26 LAB — URINALYSIS, COMPLETE (UACMP) WITH MICROSCOPIC
Bilirubin Urine: NEGATIVE
Glucose, UA: NEGATIVE mg/dL
Ketones, ur: NEGATIVE mg/dL
Nitrite: NEGATIVE
Protein, ur: 30 mg/dL — AB
RBC / HPF: >50 RBC/hpf — ABNORMAL HIGH (ref 0–5)
Specific Gravity, Urine: 1.018 (ref 1.005–1.030)
WBC, UA: >50 WBC/hpf — ABNORMAL HIGH (ref 0–5)
pH: 5 (ref 5.0–8.0)

## 2020-07-26 LAB — CBC
HCT: 29.5 % — ABNORMAL LOW (ref 36.0–46.0)
Hemoglobin: 9.6 g/dL — ABNORMAL LOW (ref 12.0–15.0)
MCH: 29 pg (ref 26.0–34.0)
MCHC: 32.5 g/dL (ref 30.0–36.0)
MCV: 89.1 fL (ref 80.0–100.0)
Platelets: 293 10*3/uL (ref 150–400)
RBC: 3.31 MIL/uL — ABNORMAL LOW (ref 3.87–5.11)
RDW: 14 % (ref 11.5–15.5)
WBC: 7.9 10*3/uL (ref 4.0–10.5)
nRBC: 0 % (ref 0.0–0.2)

## 2020-07-26 LAB — COMPREHENSIVE METABOLIC PANEL
ALT: 88 U/L — ABNORMAL HIGH (ref 0–44)
AST: 83 U/L — ABNORMAL HIGH (ref 15–41)
Albumin: 2.9 g/dL — ABNORMAL LOW (ref 3.5–5.0)
Alkaline Phosphatase: 107 U/L (ref 38–126)
Anion gap: 11 (ref 5–15)
BUN: 14 mg/dL (ref 6–20)
CO2: 21 mmol/L — ABNORMAL LOW (ref 22–32)
Calcium: 8.3 mg/dL — ABNORMAL LOW (ref 8.9–10.3)
Chloride: 108 mmol/L (ref 98–111)
Creatinine, Ser: 0.61 mg/dL (ref 0.44–1.00)
GFR calc Af Amer: >60 mL/min (ref >60)
GFR calc non Af Amer: >60 mL/min (ref >60)
Glucose, Bld: 84 mg/dL (ref 70–99)
Potassium: 3.5 mmol/L (ref 3.5–5.1)
Sodium: 140 mmol/L (ref 135–145)
Total Bilirubin: 0.6 mg/dL (ref 0.3–1.2)
Total Protein: 6.2 g/dL — ABNORMAL LOW (ref 6.5–8.1)

## 2020-07-26 LAB — TROPONIN I (HIGH SENSITIVITY): Troponin I (High Sensitivity): 3 ng/L (ref <18)

## 2020-07-26 LAB — BRAIN NATRIURETIC PEPTIDE: B Natriuretic Peptide: 53.9 pg/mL (ref 0.0–100.0)

## 2020-07-26 NOTE — ED Provider Notes (Signed)
[facility] Emergency Department Provider Note ________   First MD Initiated Contact with Patient 07/26/20 [phone]     (approximate)  I have reviewed the triage vital signs and the nursing notes.   HISTORY  Chief Complaint Leg Swelling    HPI Megan Newman is a 29 y.o. female 4 days postpartum with below list of previous medical conditions including vaginal delivery on 07/22/2020 presents to the emergency department secondary to bilateral lower extremity swelling which patient noted today.  Patient denies any chest pain or shortness of breath.  Patient states that her legs just feel "tight".  Patient states that her labor was complicated secondary to vaginal tear with resultant blood loss.  Patient states that pregnancy was uncomplicated.  Patient states that her blood pressure is usually 118-120 systolic.  Patient current blood pressure 118/83.  Patient denies any headache, no dizziness    Past Medical History:  Diagnosis Date  . Anxiety   . Ectopic pregnancy 02/2019   Ruptured right ectopic-had lap salpingostomy  . Migraine   . Ovarian cyst     Patient Active Problem List   Diagnosis Date Noted  . Normal labor 07/24/2020  . Arrest of descent, delivered, current hospitalization 07/24/2020  . Vacuum-assisted vaginal delivery 07/24/2020  . Postpartum care following vaginal delivery 07/24/2020  . Acute blood loss anemia 07/24/2020  . Pregnancy 07/22/2020  . Labor and delivery, indication for care 07/21/2020  . Obesity in pregnancy 06/25/2020  . Obesity (BMI 35.0-39.9 without comorbidity) 06/25/2020  . Migraine 11/23/2019  . Anxiety 11/23/2019  . Supervision of other normal pregnancy, antepartum 11/23/2019  . History of ectopic pregnancy 05/26/2019  . Ruptured right tubal ectopic pregnancy causing hemoperitoneum 03/21/2019  . History of abnormal cervical Pap smear 11/10/2013  . Irregular menstrual bleeding 11/10/2013  . Pelvic pain in female  07/28/2013    Past Surgical History:  Procedure Laterality Date  . arthroscopic knee surgery (Right knee)     right knee  . LAPAROSCOPY N/A 03/21/2019   Procedure: LAPAROSCOPY DIAGNOSTIC, right sapingostomy;  Surgeon: Nadara Mustard, MD;  Location: [facility] ORS;  Service: Gynecology;  Laterality: N/A;  . MYRINGOTOMY Bilateral     Prior to Admission medications   Medication Sig Start Date End Date Taking? Authorizing Provider  acetaminophen (TYLENOL) 500 MG tablet Take 2 tablets (1,000 mg total) by mouth every 6 (six) hours as needed. 07/24/20   Farrel Conners, CNM  docusate sodium (COLACE) 100 MG capsule Take 1 capsule (100 mg total) by mouth daily. 07/24/20   Farrel Conners, CNM  ferrous sulfate (FERROUSUL) 325 (65 FE) MG tablet Take 1 tablet (325 mg total) by mouth daily with breakfast. 07/24/20   Farrel Conners, CNM  ibuprofen (ADVIL) 600 MG tablet Take 1 tablet (600 mg total) by mouth every 6 (six) hours as needed for mild pain, moderate pain or cramping. 07/24/20   Farrel Conners, CNM  Prenatal 28-0.8 MG TABS Take 1 tablet by mouth daily.    [provider]    Allergies Cefixime  Family History  Problem Relation Age of Onset  . Breast cancer Mother 74  . Lung cancer Maternal Grandmother   . Heart disease Paternal Grandmother     Social History Social History   Tobacco Use  . Smoking status: Never Smoker  . Smokeless tobacco: Never Used  Vaping Use  . Vaping Use: Never used  Substance Use Topics  . Alcohol use: Not Currently    Comment: occassional  . Drug  use: No    Review of Systems Constitutional: No fever/chills Eyes: No visual changes. ENT: No sore throat. Cardiovascular: Denies chest pain. Respiratory: Denies shortness of breath. Gastrointestinal: No abdominal pain.  No nausea, no vomiting.  No diarrhea.  No constipation. Genitourinary: Negative for dysuria. Musculoskeletal: Negative for neck pain.  Negative for back pain.  Positive for  bilateral lower extremity swelling Integumentary: Negative for rash. Neurological: Negative for headaches, focal weakness or numbness.   ____________________________________________   PHYSICAL EXAM:  VITAL SIGNS: ED Triage Vitals  Enc Vitals Group     BP 07/25/20 2309 123/81     Pulse Rate 07/25/20 2309 72     Resp 07/25/20 2309 18     Temp 07/25/20 2309 98.1 F (36.7 C)     Temp Source 07/25/20 2309 Oral     SpO2 07/25/20 2309 99 %     Weight 07/25/20 2304 88.5 kg (195 lb)     Height 07/25/20 2304 1.676 m (5\' 6" )     Head Circumference --      Peak Flow --      Pain Score 07/25/20 2304 0     Pain Loc --      Pain Edu? --      Excl. in GC? --     Constitutional: Alert and oriented.  Eyes: Conjunctivae are normal.  Head: Atraumatic. Mouth/Throat: Patient is wearing a mask. Neck: No stridor.  No meningeal signs.   Cardiovascular: Normal rate, regular rhythm. Good peripheral circulation. Grossly normal heart sounds. Respiratory: Normal respiratory effort.  No retractions. Gastrointestinal: Soft and nontender. No distention.  Musculoskeletal: 2+ nonpitting bilateral lower extremity edema Neurologic:  Normal speech and language. No gross focal neurologic deficits are appreciated.  Skin:  Skin is warm, dry and intact. Psychiatric: Mood and affect are normal. Speech and behavior are normal.  ____________________________________________   LABS (all labs ordered are listed, but only abnormal results are displayed)  Labs Reviewed  URINALYSIS, COMPLETE (UACMP) WITH MICROSCOPIC - Abnormal; Notable for the following components:      Result Value   Color, Urine YELLOW (*)    APPearance CLOUDY (*)    Hgb urine dipstick LARGE (*)    Protein, ur 30 (*)    Leukocytes,Ua LARGE (*)    RBC / HPF >50 (*)    WBC, UA >50 (*)    Bacteria, UA RARE (*)    Non Squamous Epithelial PRESENT (*)    All other components within normal limits  CBC - Abnormal; Notable for the following  components:   RBC 3.31 (*)    Hemoglobin 9.6 (*)    HCT 29.5 (*)    All other components within normal limits  COMPREHENSIVE METABOLIC PANEL - Abnormal; Notable for the following components:   CO2 21 (*)    Calcium 8.3 (*)    Total Protein 6.2 (*)    Albumin 2.9 (*)    AST 83 (*)    ALT 88 (*)    All other components within normal limits  BRAIN NATRIURETIC PEPTIDE  TROPONIN I (HIGH SENSITIVITY)  TROPONIN I (HIGH SENSITIVITY)   ____________________________________________  EKG   RADIOLOGY I, [place], personally viewed and evaluated these images (plain radiographs) as part of my medical decision making, as well as reviewing the written report by the radiologist.  ED MD interpretation: Negative right lower extremity ultrasound per radiologist.  No active disease noted on chest x-ray per radiologist.  Official radiology report(s): 07/27/20 Venous Img Lower Unilateral  Right  Result Date: 07/25/2020 CLINICAL DATA:  29 year old female with right lower extremity swelling. EXAM: Right LOWER EXTREMITY VENOUS DOPPLER ULTRASOUND TECHNIQUE: Gray-scale sonography with compression, as well as color and duplex ultrasound, were performed to evaluate the deep venous system(s) from the level of the common femoral vein through the popliteal and proximal calf veins. COMPARISON:  None. FINDINGS: VENOUS Normal compressibility of the common femoral, superficial femoral, and popliteal veins, as well as the visualized calf veins. Visualized portions of profunda femoral vein and great saphenous vein unremarkable. No filling defects to suggest DVT on grayscale or color Doppler imaging. Doppler waveforms show normal direction of venous flow, normal respiratory plasticity and response to augmentation. Limited views of the contralateral common femoral vein are unremarkable. OTHER None. Limitations: none IMPRESSION: Negative. Electronically Signed   By: Elgie Collard M.D.   On: 07/25/2020 23:47   DG Chest  Portable 1 View  Result Date: 07/26/2020 CLINICAL DATA:  Lower extremity edema.  Postpartum EXAM: PORTABLE CHEST 1 VIEW COMPARISON:  None. FINDINGS: The heart size and mediastinal contours are within normal limits. Both lungs are clear. The visualized skeletal structures are unremarkable. IMPRESSION: No active disease. Electronically Signed   By: Marnee Spring M.D.   On: 07/26/2020 05:03      Procedures   ____________________________________________   INITIAL IMPRESSION / MDM / ASSESSMENT AND PLAN / ED COURSE  As part of my medical decision making, I reviewed the following data within the electronic MEDICAL RECORD NUMBER   29 year old female presented with above-stated history and physical exam a differential diagnosis including but not limited to bilateral lower extremity edema secondary to third spacing, preeclampsia, pregnancy-induced cardiomyopathy.  Patient normotensive in the emergency department.  Patient discussed with Dr. Bonney Aid OB/GYN who agreed that this is third spacing given the fact that the patient's laboratory data is reassuring normal platelets, patient is normotensive  ____________________________________________  FINAL CLINICAL IMPRESSION(S) / ED DIAGNOSES  Final diagnoses:  Peripheral edema     MEDICATIONS GIVEN DURING THIS VISIT:  Medications - No data to display   ED Discharge Orders    None      *Please note:  Winni Ehrhard was evaluated in Emergency Department on 07/26/2020 for the symptoms described in the history of present illness. She was evaluated in the context of the global COVID-19 pandemic, which necessitated consideration that the patient might be at risk for infection with the SARS-CoV-2 virus that causes COVID-19. Institutional protocols and algorithms that pertain to the evaluation of patients at risk for COVID-19 are in a state of rapid change based on information released by regulatory bodies including the CDC and federal and state  organizations. These policies and algorithms were followed during the patient's care in the ED.  Some ED evaluations and interventions may be delayed as a result of limited staffing during and after the pandemic.*  Note:  This document was prepared using Dragon voice recognition software and may include unintentional dictation errors.   Darci Current, MD 07/26/20 [phone]

## 2020-08-10 ENCOUNTER — Other Ambulatory Visit: Payer: Self-pay

## 2020-08-10 ENCOUNTER — Ambulatory Visit (INDEPENDENT_AMBULATORY_CARE_PROVIDER_SITE_OTHER): Payer: Managed Care, Other (non HMO) | Admitting: Obstetrics and Gynecology

## 2020-08-10 ENCOUNTER — Encounter: Payer: Self-pay | Admitting: Obstetrics and Gynecology

## 2020-08-10 DIAGNOSIS — O8609 Infection of obstetric surgical wound, other surgical site: Secondary | ICD-10-CM

## 2020-08-10 DIAGNOSIS — O9081 Anemia of the puerperium: Secondary | ICD-10-CM

## 2020-08-10 DIAGNOSIS — N760 Acute vaginitis: Secondary | ICD-10-CM

## 2020-08-10 MED ORDER — AMOXICILLIN-POT CLAVULANATE 875-125 MG PO TABS: 1.0000 | ORAL_TABLET | Freq: Two times a day (BID) | ORAL | 0 refills | Status: AC

## 2020-08-10 NOTE — Patient Instructions (Signed)
Care of a Perineal Tear A perineal tear is a cut or tear (laceration) in the tissue between the opening of the vagina and the anus (perineum). Some women develop a perineal tear during a vaginal birth. This can happen as the baby emerges from the birth canal and the perineum is stretched. There are four degrees of perineal tears based on how deep and long the laceration is:  First degree. This involves a shallow tear at the edge of the vaginal opening that extends slightly into the perineal skin.  Second degree. This involves tearing described in first degree perineal tear, and an additional deeper tear of the vaginal opening and perineal tissues. It may also include tearing of a muscle just under the perineal skin.  Third degree. This involves tearing described in first and second degree perineal tears, with the addition that tearing in the third degree extends into the muscle of the anus (anal sphincter).  Fourth degree. This involves all levels of tears described in first, second, and third degree perineal tears, with the tear in the fourth degree extending into the rectum. First and second degree perineal tears may or may not be stitched closed, depending on their location and appearance. Third and fourth degree perineal tears are stitched closed immediately after the baby's birth. What are the risks? Depending on the type of perineal tear you have, you may be at risk for:  Bleeding.  Developing a collection of blood in the perineal tear area (hematoma).  Pain. This may include pain when you urinate, or pain when you have a bowel movement.  Infection at the site of the tear.  Fever.  Trouble controlling your urination or bowels (incontinence).  Painful sex. How to care for a perineal tear Wound care  Take a sitz bath as told by your health care provider. A sitz bath is a warm water bath that is taken while you are sitting down. The water should only come up to your hips and should  cover your buttocks. This can speed up healing. 1. Partially fill a bathtub with warm water. You will only need the water to be deep enough to cover your hips and buttocks when you are sitting in it. 2. If your health care provider told you to put medicine in the water, follow the directions exactly as told. 3. Sit in the water and open the tub drain a little. 4. Turn on the warm water again to keep the tub at the correct level. Keep the water running constantly. 5. Soak in the water for 15-20 minutes or as told by your health care provider. 6. After the sitz bath, pat the affected area dry first. Do not rub it. 7. Be careful when you stand up after the sitz bath because you may feel dizzy.  Wash your hands before and after applying medicine to the area.  Wear a sanitary pad as told by your health care provider. Change the pad as often as told by your health care provider.  Leave stitches (sutures), skin glue, or adhesive strips in place. These skin closures may need to stay in place for 2 weeks or longer. If adhesive strip edges start to loosen and curl up, you may trim the loose edges. Do not remove adhesive strips completely unless your health care provider tells you to do that.  Check your wound every day for signs of infection. Check for: ? Redness, swelling, or pain. ? Fluid or blood. ? Warmth. ? Pus or a bad  smell. Managing pain  If directed, put ice on the painful area: ? Put ice in a plastic bag. ? Place a towel between your skin and the bag. ? Leave the ice on for 20 minutes, 2-3 times a day.  Apply a numbing spray to the perineal tear site as told by your health care provider. This may help with discomfort.  Take and apply over-the-counter and prescription medicines only as told by your health care provider.  If told, put about 3 witch hazel-containing hemorrhoid treatment pads on top of your sanitary pad. The witch hazel in the hemorrhoid pads helps with swelling and  discomfort.  Sit on an inflatable ring or pillow. This may provide comfort. General instructions  Squeeze warm water on your perineum after urinating. This should be done from front to back with a squeeze bottle. Pat the area to dry it.  Do not have sex, use tampons, or place anything in your vagina for at least 6 weeks or as told by your health care provider.  Keep all follow-up visits as told by your health care provider. These include any postpartum visits. This is important. Contact a health care provider if:  Your pain is not relieved with medicines.  You have painful urination.  You have redness, swelling, or pain around your tear.  You have fluid or blood coming from your tear.  Your tear feels warm to the touch.  You have pus or a bad smell coming from your tear.  You have a fever. Get help right away if:  Your tear opens.  You cannot urinate.  You have an increase in bleeding.  You have severe pain. Summary  A perineal tear is a cut or tear (laceration) in the tissue between the opening of the vagina and the anus (perineum).  There are four degrees of perineal tears based on how deep and long the laceration is.  First and second-degree perineal tears may or may not be stitched closed, depending on their location and appearance. Third and fourth- degree perineal tears are stitched closed immediately after the baby's birth.  Follow your health care provider's instructions for caring for your perineal tear. Know how to manage pain and how to care for your wound. Know when to call your health care provider and when to seek immediate emergency care. This information is not intended to replace advice given to you by your health care provider. Make sure you discuss any questions you have with your health care provider. Document Revised: 11/28/2017 Document Reviewed: 01/20/2017 Elsevier Patient Education  2020 [employer].

## 2020-08-10 NOTE — Progress Notes (Signed)
  OBSTETRICS POSTPARTUM CLINIC PROGRESS NOTE  Subjective:     Megan Newman is a 29 y.o. G21P1011 female who presents for a postpartum visit. She is 3 weeks postpartum following a Term pregnancy and delivery by VAVD with stellate laceration.  I have fully reviewed the prenatal and intrapartum course. Anesthesia: epidural.  Postpartum course has been complicated by uncomplicated.  Baby is feeding by Breast.  Bleeding: patient has not  resumed menses.  Bowel function is normal. Bladder function is normal.  Patient is not sexually active. Contraception method desired is OCP vs POP.  Postpartum depression screening: negative. Edinburgh 4.  The following portions of the patient's history were reviewed and updated as appropriate: allergies, current medications, past family history, past medical history, past social history, past surgical history and problem list.  Review of Systems Pertinent items are noted in HPI.  Objective:    BP 120/72   Pulse 76   Resp 18   Ht 5\' 6"  (1.676 m)   Wt 198 lb 6.4 oz (90 kg)   SpO2 98%   Breastfeeding Yes   BMI 32.02 kg/m   General:  alert and no distress   Breasts:  inspection negative, no nipple discharge or bleeding, no masses or nodularity palpable  Lungs: clear to auscultation bilaterally  Heart:  regular rate and rhythm, S1, S2 normal, no murmur, click, rub or gallop  Abdomen: soft, non-tender; bowel sounds normal; no masses,  no organomegaly.     Vulva:  normal  Vagina: Scant yellow discharge. - suture dissolving, malodorous. No wound breakdown.   Cervix:  no cervical motion tenderness and no lesions  Corpus: normal size, contour, position, consistency, mobility, non-tender  Adnexa:  normal adnexa and no mass, fullness, tenderness  Rectal Exam: Not performed.          Assessment:  Post Partum Care visit 1. Second degree perineal laceration during delivery - amoxicillin-clavulanate (AUGMENTIN) 875-125 MG tablet; Take 1 tablet by  mouth 2 (two) times daily for 14 days.  Dispense: 28 tablet; Refill: 0  2. Infection of perineal wound following delivery Encouraged initation of sitz baths and continued perineal care. - amoxicillin-clavulanate (AUGMENTIN) 875-125 MG tablet; Take 1 tablet by mouth 2 (two) times daily for 14 days.  Dispense: 28 tablet; Refill: 0 - NuSwab BV and Candida, NAA  3. Acute vaginitis  - NuSwab BV and Candida, NAA  4. Anemia, postpartum Encouraged taking iron supplement - Ferritin - B12 - Iron Binding Cap (TIBC)   Plan:  See orders and Patient Instructions Follow up in: 2 week or as needed.   MD, Adelene Idler OB/GYN, [facility] 08/10/2020 10:28 AM

## 2020-08-11 LAB — VITAMIN B12: Vitamin B-12: 948 pg/mL (ref 232–1245)

## 2020-08-11 LAB — IRON AND TIBC
Iron Saturation: 10 % — ABNORMAL LOW (ref 15–55)
Iron: 40 ug/dL (ref 27–159)
Total Iron Binding Capacity: 418 ug/dL (ref 250–450)
UIBC: 378 ug/dL (ref 131–425)

## 2020-08-11 LAB — FERRITIN: Ferritin: 22 ng/mL (ref 15–150)

## 2020-08-12 LAB — NUSWAB BV AND CANDIDA, NAA
Candida albicans, NAA: NEGATIVE
Candida glabrata, NAA: NEGATIVE

## 2020-08-14 ENCOUNTER — Telehealth: Payer: Self-pay

## 2020-08-14 NOTE — Telephone Encounter (Signed)
Pt calling; bleeding from pp peroneal stitches; be seen?; what to do?  [phone]  Pt states blood is on her pantyliner and she sees blood when she wipes; can't really tell if from episiotomy or from vagina.  Adv to be seen; tx'd to [facility] for scheduling.

## 2020-08-15 ENCOUNTER — Other Ambulatory Visit: Payer: Self-pay

## 2020-08-15 ENCOUNTER — Other Ambulatory Visit: Payer: Self-pay | Admitting: Certified Nurse Midwife

## 2020-08-15 ENCOUNTER — Ambulatory Visit (INDEPENDENT_AMBULATORY_CARE_PROVIDER_SITE_OTHER): Payer: Managed Care, Other (non HMO) | Admitting: Obstetrics

## 2020-08-15 DIAGNOSIS — O8609 Infection of obstetric surgical wound, other surgical site: Secondary | ICD-10-CM

## 2020-08-15 MED ORDER — MUPIROCIN CALCIUM 2 % EX CREA: 1.0000 "application " | TOPICAL_CREAM | Freq: Two times a day (BID) | CUTANEOUS | 0 refills | Status: AC

## 2020-08-15 NOTE — Progress Notes (Signed)
Megan Newman returns to the office per Dr. Bjorn Pippin, for ongoing evauationl of her perineal laceration and repair from her VAVD 4 weeks ago. She reports that both she and the baby are doing well; she is continuing to breastfeed, and does not have perineal pain. She was seen last week by Dr. Jerene Pitch, and her perineum was in the process of healing. She was instructed to do BID sitz baths, was placed on antibiotics, and then was rescheduled for today's visit.  O: Pleasant, smiling patient, in NAD Perineal exam:  No visible vaginal bleeding noted.     The tissue at the introitus is intact, with a number sutures visible. A tiny superficial laceration is noted at the base of the vagina that is tender to touch.  A: 4 weeks postpartum , S/P VAVD more complex laceration continuing to heal; P: Dr. Jerene Pitch was consulted , and then assisted in removing several protruding sutures from the otherwise healing laceration.  The patient is instructed to continue BID sitz baths and she may use Dreeft detergent or Epsom salts in her bath water. RX for Bactorban cream prescribed. RTC in 2 weeks for 6 wk PP visit and recheck.  Megan Newman, CNM  08/15/2020 3:52 PM

## 2020-08-31 ENCOUNTER — Encounter: Payer: Self-pay | Admitting: Obstetrics and Gynecology

## 2020-08-31 ENCOUNTER — Other Ambulatory Visit: Payer: Self-pay

## 2020-08-31 ENCOUNTER — Ambulatory Visit (INDEPENDENT_AMBULATORY_CARE_PROVIDER_SITE_OTHER): Payer: Managed Care, Other (non HMO) | Admitting: Obstetrics and Gynecology

## 2020-08-31 VITALS — BP 118/72 | HR 72 | Resp 18 | Ht 66.0 in | Wt 193.6 lb

## 2020-08-31 DIAGNOSIS — N760 Acute vaginitis: Secondary | ICD-10-CM

## 2020-08-31 DIAGNOSIS — Z3009 Encounter for other general counseling and advice on contraception: Secondary | ICD-10-CM

## 2020-08-31 LAB — POCT URINALYSIS DIPSTICK
Bilirubin, UA: NEGATIVE
Blood, UA: NEGATIVE
Glucose, UA: NEGATIVE
Ketones, UA: NEGATIVE
Nitrite, UA: NEGATIVE
Protein, UA: NEGATIVE
Spec Grav, UA: 1.02 (ref 1.010–1.025)
Urobilinogen, UA: 0.2 E.U./dL
pH, UA: 5.5 (ref 5.0–8.0)

## 2020-08-31 MED ORDER — NORETHIN ACE-ETH ESTRAD-FE 1-20 MG-MCG PO TABS: 1.0000 | ORAL_TABLET | Freq: Every day | ORAL | 11 refills | Status: AC

## 2020-08-31 NOTE — Progress Notes (Signed)
  OBSTETRICS POSTPARTUM CLINIC PROGRESS NOTE  Subjective:     Megan Newman is a 29 y.o. G78P1011 female who presents for a postpartum visit. She is 6 weeks postpartum following a Term pregnancy and delivery by VAVD.  I have fully reviewed the prenatal and intrapartum course. Anesthesia: epidural.  Postpartum course has been complicated by complicated by wound infection and stellate laceration with slow healing.Pecola Leisure is feeding by Breast.  Bleeding: patient has not  resumed menses.  Bowel function is normal. Bladder function is normal.  Patient is not sexually active. Contraception method desired is none.  Postpartum depression screening: negative.  The following portions of the patient's history were reviewed and updated as appropriate: allergies, current medications, past family history, past medical history, past social history, past surgical history and problem list.  Review of Systems Pertinent items are noted in HPI.  Objective:    BP 118/72   Pulse 72   Resp 18   Ht 5\' 6"  (1.676 m)   Wt 193 lb 9.6 oz (87.8 kg)   SpO2 98%   Breastfeeding Yes   BMI 31.25 kg/m   General:  alert and no distress   Breasts:  inspection negative, no nipple discharge or bleeding, no masses or nodularity palpable  Lungs: clear to auscultation bilaterally  Heart:  regular rate and rhythm, S1, S2 normal, no murmur, click, rub or gallop  Abdomen: soft, non-tender; bowel sounds normal; no masses,  no organomegaly.    Vulva:  normal  Vagina: normal vagina, Purulent yellow discharge, exudate, lesion, or erythema  Cervix:  no cervical motion tenderness and no lesions  Corpus: normal size, contour, position, consistency, mobility, non-tender  Adnexa:  normal adnexa and no mass, fullness, tenderness  Rectal Exam: Not performed.          Assessment:  Post Partum Care visit 1. Acute vaginitis Has completed antibiotics without improvement in yellow discharge. - NuSwab Vaginitis Plus (VG+) -  Wound culture   Plan:  See orders and Patient Instructions Follow up in: 12 months or as needed.    MD, Adelene Idler OB/GYN, [facility] 08/31/2020 3:11 PM

## 2020-09-05 LAB — NUSWAB VAGINITIS PLUS (VG+)
Candida albicans, NAA: NEGATIVE
Candida glabrata, NAA: NEGATIVE
Chlamydia trachomatis, NAA: NEGATIVE
Neisseria gonorrhoeae, NAA: NEGATIVE
Trich vag by NAA: NEGATIVE

## 2020-09-06 ENCOUNTER — Other Ambulatory Visit: Payer: Self-pay | Admitting: Obstetrics and Gynecology

## 2020-09-06 DIAGNOSIS — A498 Other bacterial infections of unspecified site: Secondary | ICD-10-CM

## 2020-09-06 LAB — WOUND CULTURE

## 2020-09-06 MED ORDER — CIPROFLOXACIN HCL 750 MG PO TABS: 750.0000 mg | ORAL_TABLET | Freq: Two times a day (BID) | ORAL | 0 refills | Status: AC

## 2020-09-14 ENCOUNTER — Telehealth: Payer: Self-pay

## 2020-09-14 MED ORDER — FERROUS SULFATE 325 (65 FE) MG PO TBEC: 1.0000 | DELAYED_RELEASE_TABLET | Freq: Every morning | ORAL | 11 refills | Status: AC

## 2020-09-14 NOTE — Telephone Encounter (Signed)
[facility] [address] sent request for refill of Ferrous Sulfate EC 325mg  tablet #30 c 1rf; last filled 08-17-2020.  [phone]  Refill on 08/31/20 does not have quantity and refills.

## 2020-09-14 NOTE — Telephone Encounter (Signed)
She can have 11 refills thank you

## 2020-09-14 NOTE — Telephone Encounter (Signed)
#  30 c 11 rf successfully eRx'd.

## 2020-09-22 ENCOUNTER — Telehealth: Payer: Self-pay

## 2020-09-22 NOTE — Telephone Encounter (Signed)
Pt calling; 9wks pp tomorrow; is finishing an antibx; is having blood in her d/c.  [phone]  Pt states she has completely stopped bleeding after delivery and then this started about 2d ago; adv antibx wouldn't cause this; adv probably her period trying to start.  Adv pt this first period after delivery may be heavier than normal but we still don't want her to saturate a pad q30min-1hr; if she does it's our policy for her to go to the ED. Pt voices understanding.

## 2020-10-03 NOTE — Progress Notes (Signed)
Tomasa Rand, [place]   Chief Complaint  Patient presents with  . Vaginal Discharge    no odor or itchiness, lower abdominal pain, denies uti sx    HPI:      Ms. Darlynn Ricco is a 29 y.o. G2P1011 whose LMP was No LMP recorded., presents today for persistent increased vag d/c without odor/irritation since delivery. Had neg STDs, BV and yeast on 9/21 nuswab. Sx have persisted. Sometimes yellow d/c but no other sx. Pt is BF. Had a few days spotting last wk. Has had menstrual type cramping, no meds needed; no real menses. No urin sx, no GI sx except constipation which is normal for pt. No fevers.  Did cipro 9/21 for pseudomonas infection of perineal laceration after delivery. Sx resolved and pt doing well.  Not sex active. Not taking BC. Exclusively BF without problems.   Past Medical History:  Diagnosis Date  . Anxiety   . Ectopic pregnancy 02/2019   Ruptured right ectopic-had lap salpingostomy  . Migraine   . Ovarian cyst     Past Surgical History:  Procedure Laterality Date  . arthroscopic knee surgery (Right knee)     right knee  . LAPAROSCOPY N/A 03/21/2019   Procedure: LAPAROSCOPY DIAGNOSTIC, right sapingostomy;  Surgeon: Nadara Mustard, MD;  Location: [facility] ORS;  Service: Gynecology;  Laterality: N/A;  . MYRINGOTOMY Bilateral     Family History  Problem Relation Age of Onset  . Breast cancer Mother 18  . Lung cancer Maternal Grandmother   . Heart disease Paternal Grandmother     Social History   Socioeconomic History  . Marital status: Married    Spouse name: Merton Border "Shaun"  . Number of children: Not on file  . Years of education: 20  . Highest education level: Not on file  Occupational History  . Occupation: Occupational hygienist  Tobacco Use  . Smoking status: Never Smoker  . Smokeless tobacco: Never Used  Vaping Use  . Vaping Use: Never used  Substance and Sexual Activity  . Alcohol use: Not Currently    Comment: occassional  . Drug use: No  . Sexual  activity: Not Currently    Birth control/protection: None  Other Topics Concern  . Not on file  Social History Narrative  . Not on file   Social Determinants of Health   Financial Resource Strain:   . Difficulty of Paying Living Expenses: Not on file  Food Insecurity:   . Worried About Programme researcher, broadcasting/film/video in the Last Year: Not on file  . Ran Out of Food in the Last Year: Not on file  Transportation Needs:   . Lack of Transportation (Medical): Not on file  . Lack of Transportation (Non-Medical): Not on file  Physical Activity:   . Days of Exercise per Week: Not on file  . Minutes of Exercise per Session: Not on file  Stress:   . Feeling of Stress : Not on file  Social Connections:   . Frequency of Communication with Friends and Family: Not on file  . Frequency of Social Gatherings with Friends and Family: Not on file  . Attends Religious Services: Not on file  . Active Member of Clubs or Organizations: Not on file  . Attends Banker Meetings: Not on file  . Marital Status: Not on file  Intimate Partner Violence:   . Fear of Current or Ex-Partner: Not on file  . Emotionally Abused: Not on file  . Physically Abused: Not  on file  . Sexually Abused: Not on file    Outpatient Medications Prior to Visit  Medication Sig Dispense Refill  . mupirocin cream (BACTROBAN) 2 % Apply 1 application topically 2 (two) times daily. 15 g 0  . Prenatal 28-0.8 MG TABS Take 1 tablet by mouth daily.    Marland Kitchen docusate sodium (COLACE) 100 MG capsule Take 1 capsule (100 mg total) by mouth daily. (Patient not taking: Reported on 10/04/2020) 30 capsule 1  . ferrous sulfate 325 (65 FE) MG EC tablet Take 1 tablet (325 mg total) by mouth every morning. (Patient not taking: Reported on 10/04/2020) 30 tablet 11  . norethindrone-ethinyl estradiol (LOESTRIN FE) 1-20 MG-MCG tablet Take 1 tablet by mouth daily. (Patient not taking: Reported on 10/04/2020) 28 tablet 11  . acetaminophen (TYLENOL) 500 MG  tablet Take 2 tablets (1,000 mg total) by mouth every 6 (six) hours as needed.  0  . ibuprofen (ADVIL) 600 MG tablet Take 1 tablet (600 mg total) by mouth every 6 (six) hours as needed for mild pain, moderate pain or cramping. 30 tablet 0   No facility-administered medications prior to visit.      ROS:  Review of Systems  Constitutional: Negative for fever.  Gastrointestinal: Negative for blood in stool, constipation, diarrhea, nausea and vomiting.  Genitourinary: Positive for vaginal discharge. Negative for dyspareunia, dysuria, flank pain, frequency, hematuria, urgency, vaginal bleeding and vaginal pain.  Musculoskeletal: Negative for back pain.  Skin: Negative for rash.    OBJECTIVE:   Vitals:  BP 100/70   Ht 5\' 6"  (1.676 m)   Wt 193 lb (87.5 kg)   Breastfeeding Yes   BMI 31.15 kg/m   Physical Exam Vitals reviewed.  Constitutional:      Appearance: She is well-developed. She is not ill-appearing or toxic-appearing.  Pulmonary:     Effort: Pulmonary effort is normal.  Abdominal:     Palpations: Abdomen is soft.     Tenderness: There is no abdominal tenderness. There is no right CVA tenderness or left CVA tenderness.  Genitourinary:    General: Normal vulva.     Pubic Area: No rash.      Labia:        Right: No rash, tenderness or lesion.        Left: No rash, tenderness or lesion.      Vagina: Vaginal discharge present. No erythema or tenderness.     Cervix: Normal.     Uterus: Normal. Not enlarged and not tender.      Adnexa: Right adnexa normal and left adnexa normal.       Right: No mass or tenderness.         Left: No mass or tenderness.    Musculoskeletal:        General: Normal range of motion.     Cervical back: Normal range of motion.  Skin:    General: Skin is warm and dry.  Neurological:     General: No focal deficit present.     Mental Status: She is alert and oriented to person, place, and time.     Cranial Nerves: No cranial nerve deficit.    Psychiatric:        Mood and Affect: Mood normal.        Behavior: Behavior normal.        Thought Content: Thought content normal.        Judgment: Judgment normal.     Results: Results for orders placed or performed in  visit on 10/04/20 (from the past 24 hour(s))  POCT Wet Prep with KOH     Status: Normal   Collection Time: 10/04/20  3:34 PM  Result Value Ref Range   Trichomonas, UA Negative    Clue Cells Wet Prep HPF POC neg    Epithelial Wet Prep HPF POC     Yeast Wet Prep HPF POC neg    Bacteria Wet Prep HPF POC     RBC Wet Prep HPF POC     WBC Wet Prep HPF POC     KOH Prep POC Negative Negative     Assessment/Plan: Vaginal discharge - Plan: POCT Wet Prep with KOH; No other sx, neg wet prep, neg nuswab 9/21. Reassurance re: normal vag d/c. Can change with BF and hormones. F/u prn.     Return if symptoms worsen or fail to improve.  Kajah Santizo B. Ashmi Blas, PA-C 10/04/2020 3:36 PM

## 2020-10-04 ENCOUNTER — Encounter: Payer: Self-pay | Admitting: Obstetrics and Gynecology

## 2020-10-04 ENCOUNTER — Other Ambulatory Visit: Payer: Self-pay

## 2020-10-04 ENCOUNTER — Ambulatory Visit (INDEPENDENT_AMBULATORY_CARE_PROVIDER_SITE_OTHER): Payer: Managed Care, Other (non HMO) | Admitting: Obstetrics and Gynecology

## 2020-10-04 VITALS — BP 100/70 | Ht 66.0 in | Wt 193.0 lb

## 2020-10-04 DIAGNOSIS — N898 Other specified noninflammatory disorders of vagina: Secondary | ICD-10-CM

## 2020-10-04 LAB — POCT WET PREP WITH KOH
Clue Cells Wet Prep HPF POC: NEGATIVE
KOH Prep POC: NEGATIVE
Trichomonas, UA: NEGATIVE
Yeast Wet Prep HPF POC: NEGATIVE

## 2020-10-04 NOTE — Patient Instructions (Signed)
I value your feedback and entrusting us with your care. If you get a [facility] patient survey, I would appreciate you taking the time to let us know about your experience today. Thank you!  As of December 09, 2019, your lab results will be released to your MyChart immediately, before I even have a chance to see them. Please give me time to review them and contact you if there are any abnormalities. Thank you for your patience.  

## 2020-12-27 IMAGING — US US EXTREM LOW VENOUS*R*
1 series · 14 of 24 positions shown · non-contrast
Comparison: None.

CLINICAL DATA: 29-year-old female with right lower extremity
swelling.

EXAM:
Right LOWER EXTREMITY VENOUS DOPPLER ULTRASOUND
TECHNIQUE: Gray-scale sonography with compression, as well as color and duplex
ultrasound, were performed to evaluate the deep venous system(s)
from the level of the common femoral vein through the popliteal and
proximal calf veins.

[Series 1: us venous img lower uni right (dvt) · portal-venous · 14 of 33 slices shown]
[im 1/33]
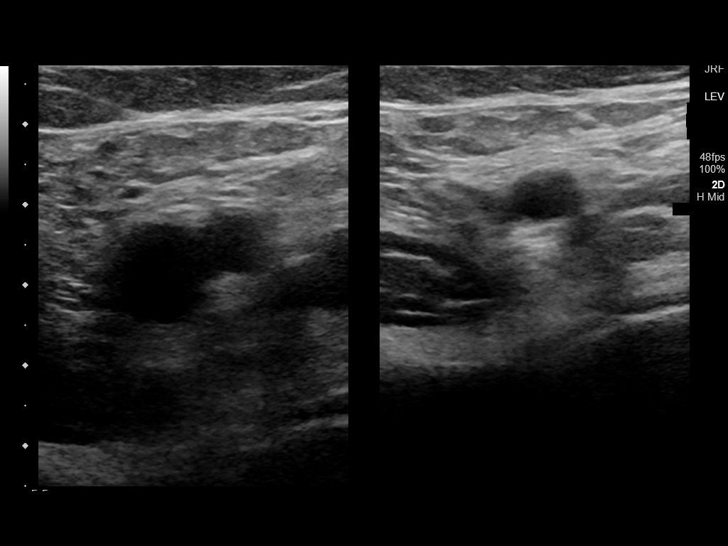
[im 3/33]
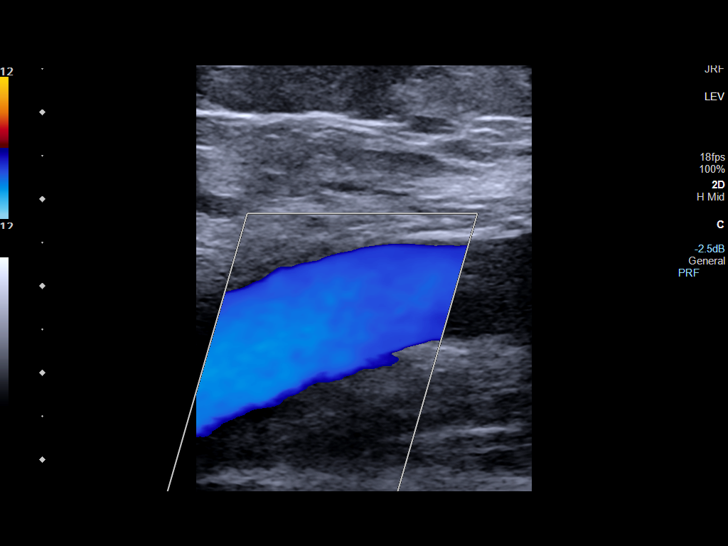
[im 6/33]
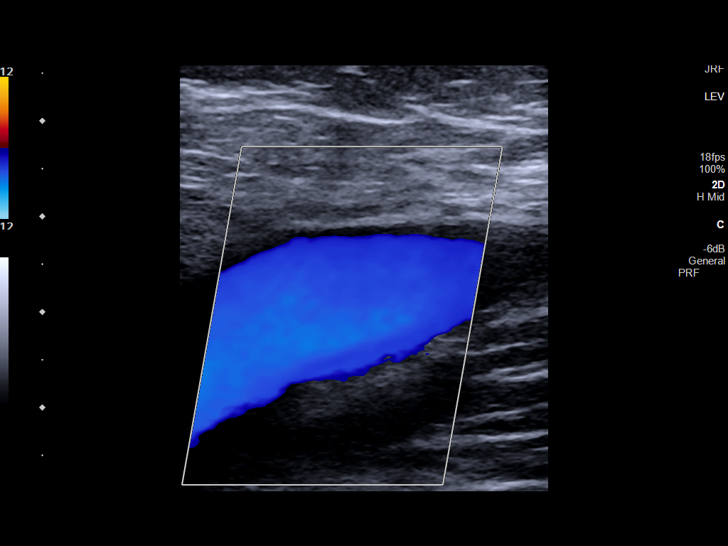
[im 9/33]
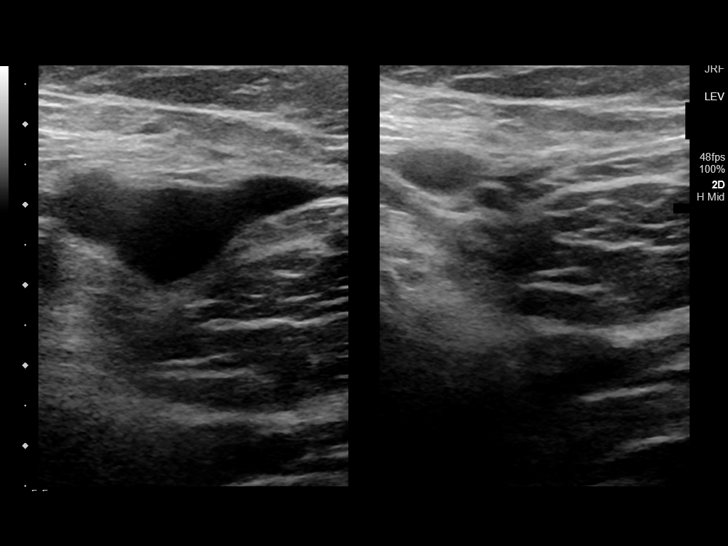
[im 10/33]
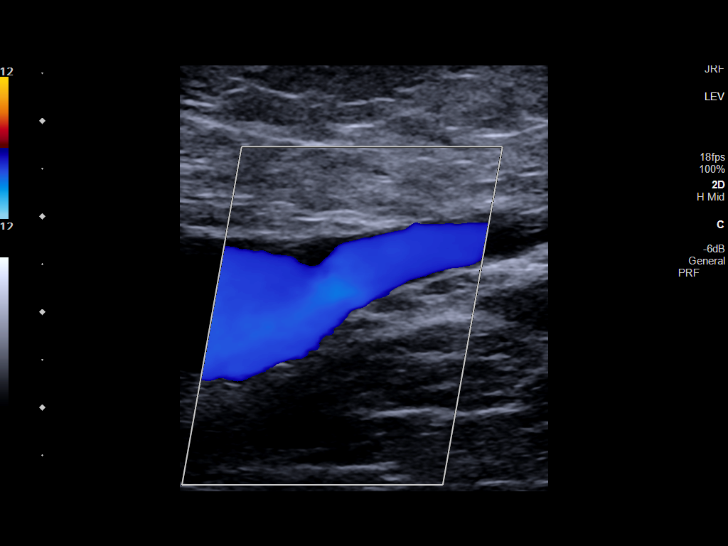
[im 13/33]
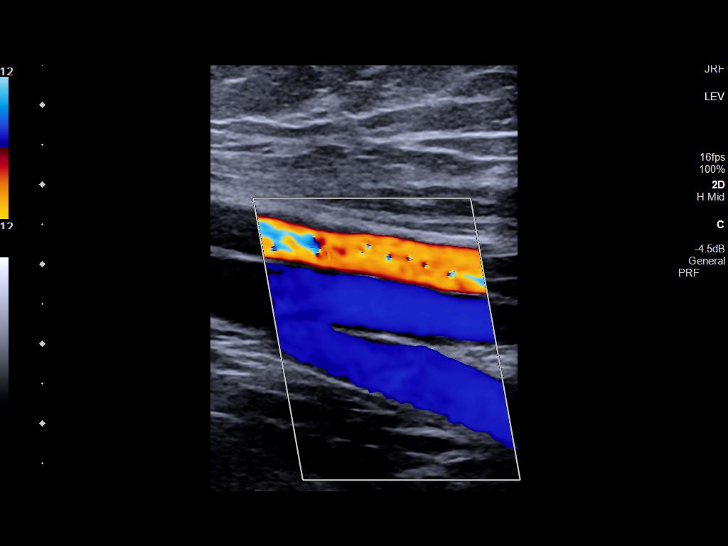
[im 16/33]
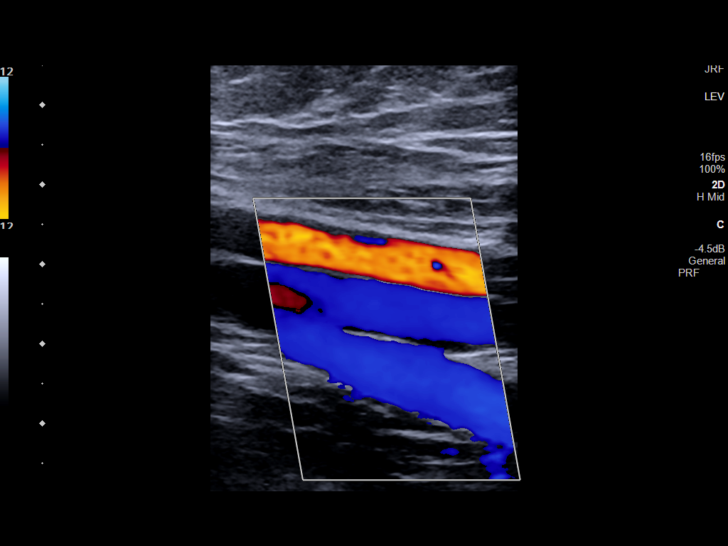
[im 17/33]
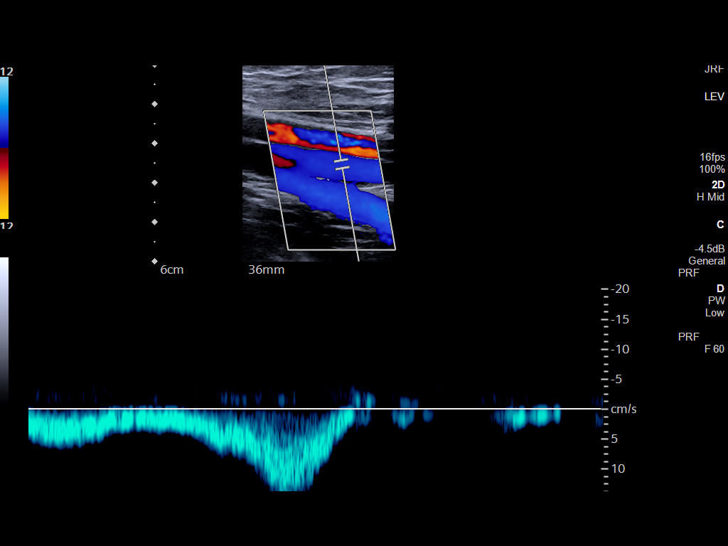
[im 20/33]
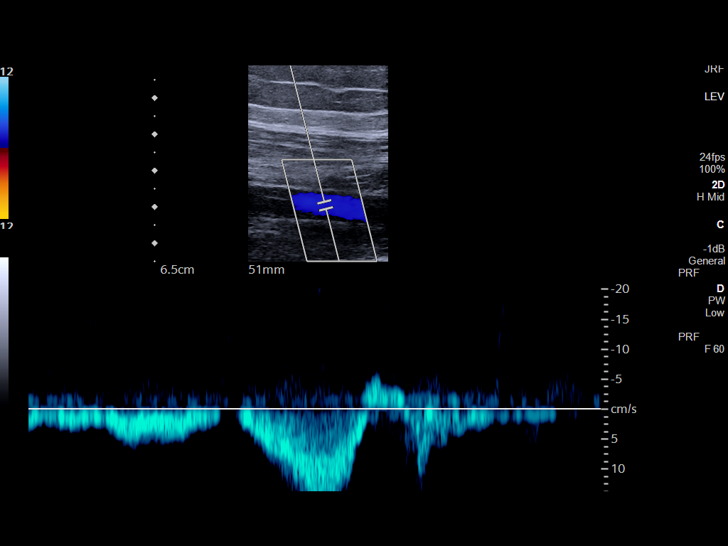
[im 23/33]
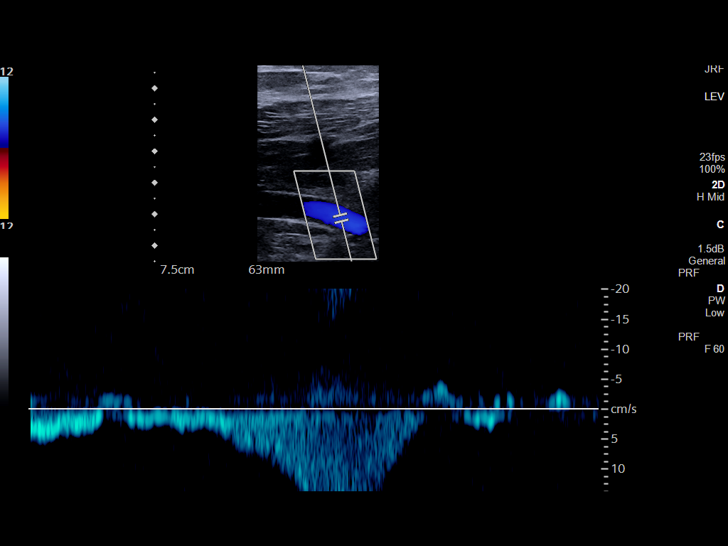
[im 26/33]
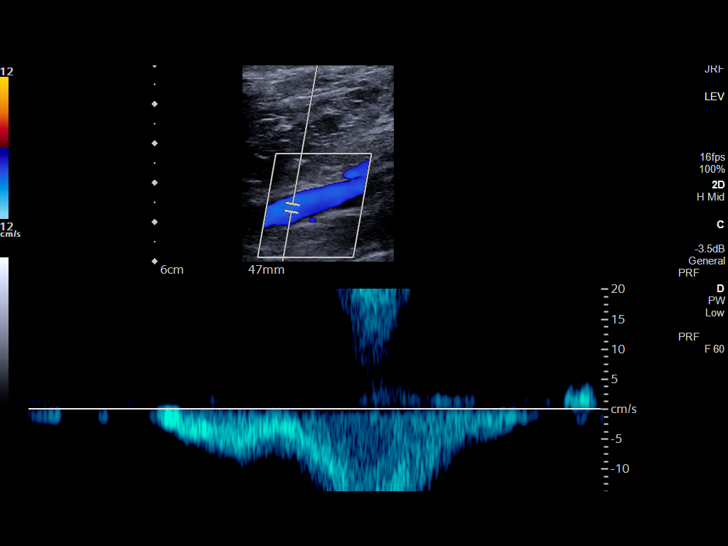
[im 27/33]
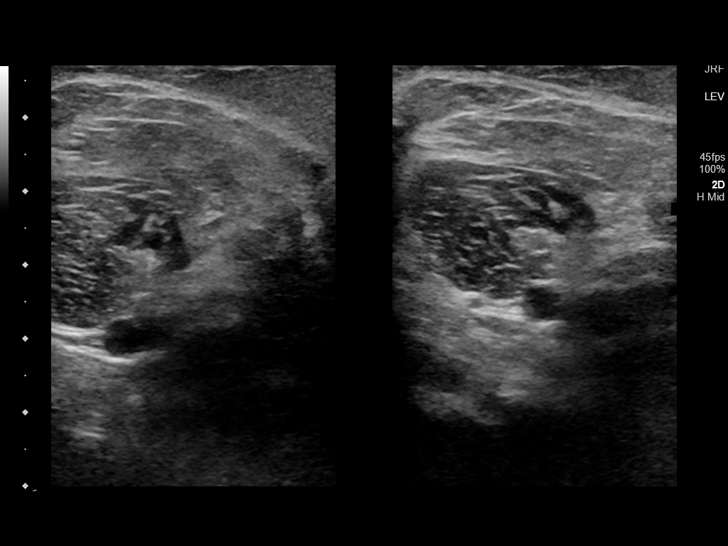
[im 30/33]
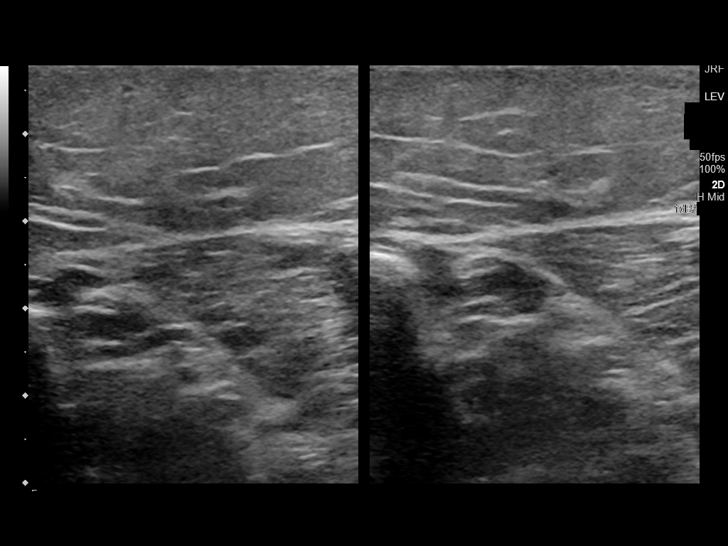
[im 33/33]
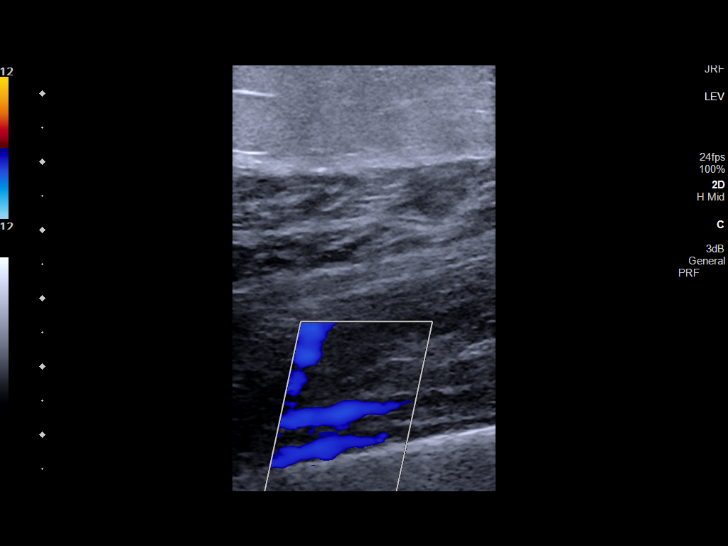

[14 of 24 positions shown; findings below may reference images not displayed]

FINDINGS: VENOUS

Normal compressibility of the common femoral, superficial femoral,
and popliteal veins, as well as the visualized calf veins.
Visualized portions of profunda femoral vein and great saphenous
vein unremarkable. No filling defects to suggest DVT on grayscale or
color Doppler imaging. Doppler waveforms show normal direction of
venous flow, normal respiratory plasticity and response to
augmentation.

Limited views of the contralateral common femoral vein are
unremarkable.

OTHER

None.

Limitations: none
IMPRESSION: Negative.

## 2020-12-28 IMAGING — DX DG CHEST 1V PORT
1 series · 1 of 1 positions shown · non-contrast
Comparison: None.

CLINICAL DATA: Lower extremity edema.  Postpartum

EXAM:
PORTABLE CHEST 1 VIEW

[chest ap]
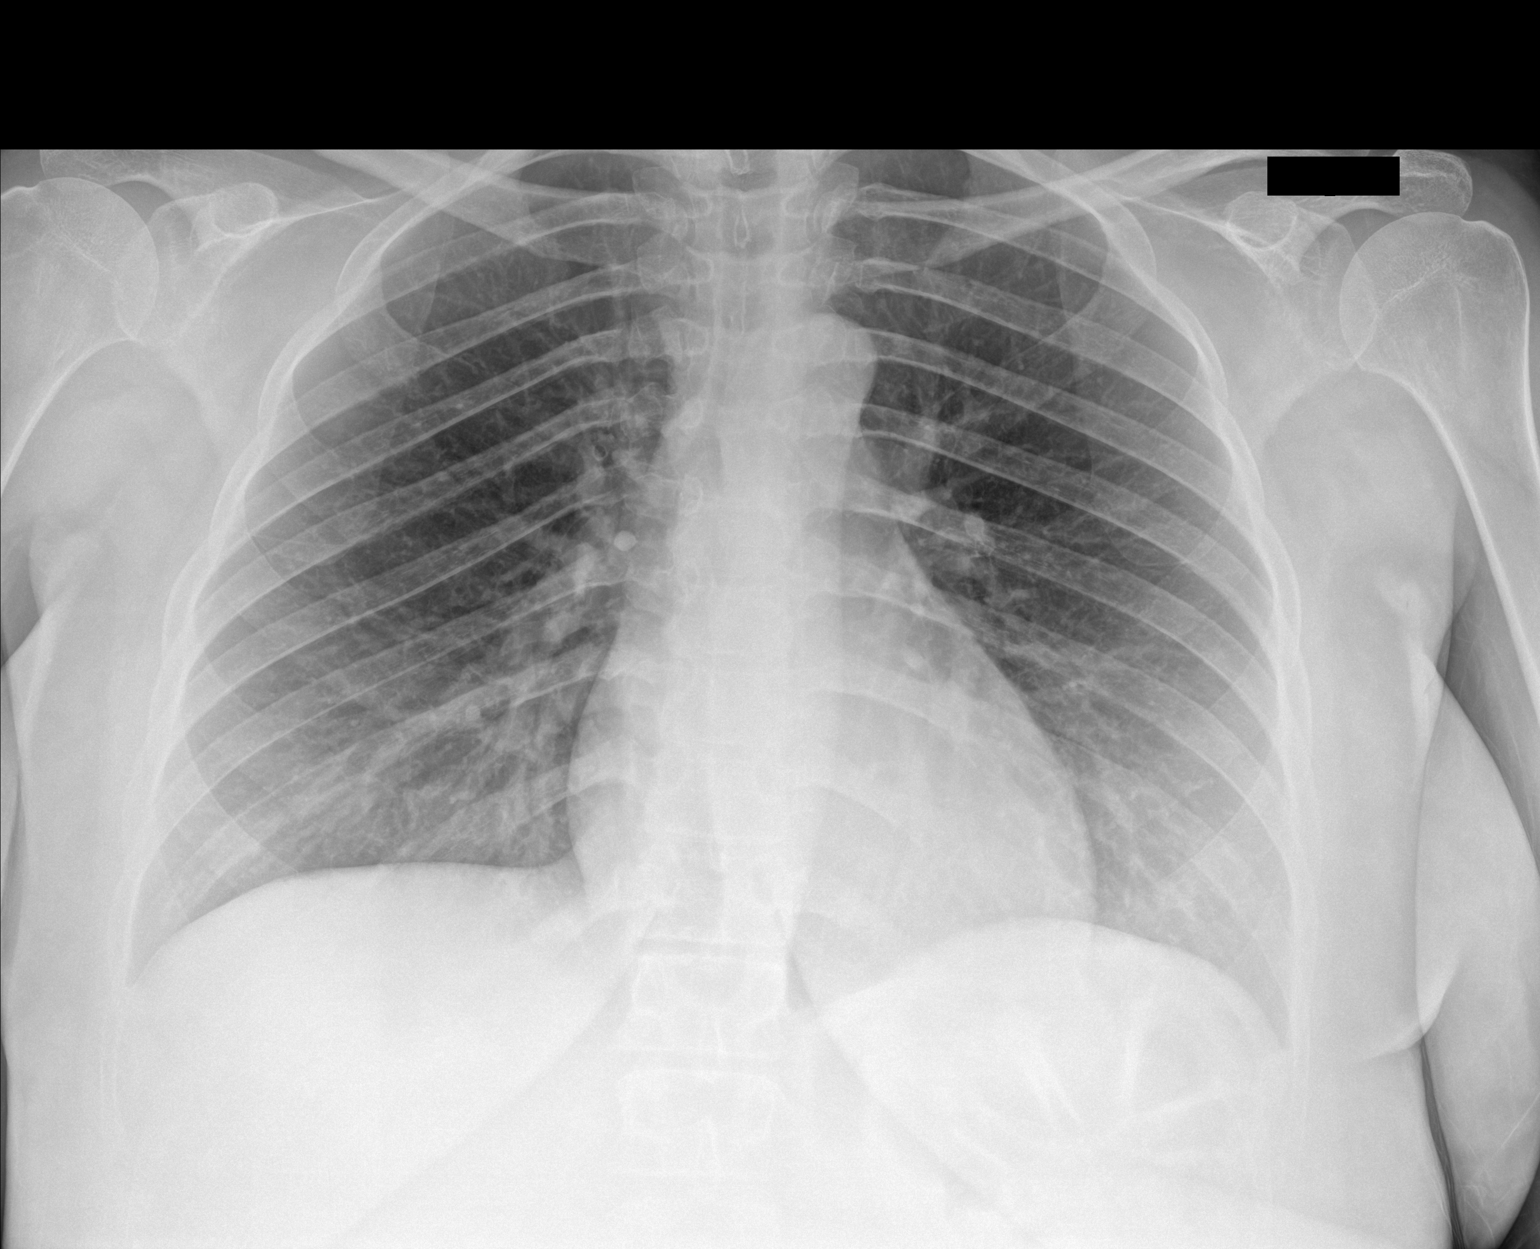

[1 of 1 positions shown; findings below may reference images not displayed]

FINDINGS: The heart size and mediastinal contours are within normal limits.
Both lungs are clear. The visualized skeletal structures are
unremarkable.
IMPRESSION: No active disease.

## 2021-04-20 ENCOUNTER — Other Ambulatory Visit: Payer: Self-pay

## 2021-04-20 ENCOUNTER — Ambulatory Visit (INDEPENDENT_AMBULATORY_CARE_PROVIDER_SITE_OTHER): Payer: Managed Care, Other (non HMO) | Admitting: Obstetrics and Gynecology

## 2021-04-20 ENCOUNTER — Encounter: Payer: Self-pay | Admitting: Obstetrics and Gynecology

## 2021-04-20 VITALS — BP 116/72 | Ht 66.0 in | Wt 172.0 lb

## 2021-04-20 DIAGNOSIS — D649 Anemia, unspecified: Secondary | ICD-10-CM | POA: Diagnosis not present

## 2021-04-20 DIAGNOSIS — R102 Pelvic and perineal pain: Secondary | ICD-10-CM | POA: Diagnosis not present

## 2021-04-20 DIAGNOSIS — R5382 Chronic fatigue, unspecified: Secondary | ICD-10-CM | POA: Diagnosis not present

## 2021-04-20 DIAGNOSIS — N912 Amenorrhea, unspecified: Secondary | ICD-10-CM

## 2021-04-20 LAB — POCT URINE PREGNANCY: Preg Test, Ur: NEGATIVE

## 2021-04-20 NOTE — Progress Notes (Signed)
Patient ID: Megan Newman, female   DOB: 08-May-1991, 30 y.o.   MRN: [id number]  Reason for Consult: Postpartum Care   Referred by Megan Rand, PA  Subjective:     HPI:  Megan Newman is a 30 y.o. female she delivered in July 2021.  She is following up today with several postpartum concerns.  She reports that she is still breast-feeding.  She breast-feeds or pumps 4-5 times a day.  She has not yet had a return of her menstrual cycle and wonders if this is normal.  She reports that she is also been since feeling very fatigued.  She was asked to take oral iron after her delivery however she discontinued it at 6 weeks postpartum.  She reports that she frequently feels cold so cold that the tips of her fingers will turn blue.  She has had a 20 pound weight loss which she is finding concerning and is not intentional.  She had a stellate perineal laceration which was complicated by infection.  This incision has healed and responded to antibiotics however she has had some discomfort with intercourse.  She also notes cramping, generally this occurs twice every week.  Her prior OB/GYN thought that she may have endometriosis however she has not previously had a diagnostic laparoscopy for this.  She took oral contraceptive pills in the past and this did help improve her pain.  She additionally reports that adhering to a gluten-free diet generally reduces her symptoms of pelvic pain.  She desires to conceive again starting in January 2023.  She does not currently desire to be on any birth control.  Past Medical History:  Diagnosis Date  . Anxiety   . Ectopic pregnancy 02/2019   Ruptured right ectopic-had lap salpingostomy  . Migraine   . Ovarian cyst    Family History  Problem Relation Age of Onset  . Breast cancer Mother 10  . Lung cancer Maternal Grandmother   . Heart disease Paternal Grandmother    Past Surgical History:  Procedure Laterality Date  . arthroscopic knee surgery  (Right knee)     right knee  . LAPAROSCOPY N/A 03/21/2019   Procedure: LAPAROSCOPY DIAGNOSTIC, right sapingostomy;  Surgeon: Nadara Mustard, MD;  Location: [facility] ORS;  Service: Gynecology;  Laterality: N/A;  . MYRINGOTOMY Bilateral     Short Social History:  Social History   Tobacco Use  . Smoking status: Never Smoker  . Smokeless tobacco: Never Used  Substance Use Topics  . Alcohol use: Not Currently    Comment: occassional    Allergies  Allergen Reactions  . Cefixime Rash    Current Outpatient Medications  Medication Sig Dispense Refill  . docusate sodium (COLACE) 100 MG capsule Take 1 capsule (100 mg total) by mouth daily. (Patient not taking: Reported on 10/04/2020) 30 capsule 1  . ferrous sulfate 325 (65 FE) MG EC tablet Take 1 tablet (325 mg total) by mouth every morning. (Patient not taking: Reported on 10/04/2020) 30 tablet 11  . mupirocin cream (BACTROBAN) 2 % Apply 1 application topically 2 (two) times daily. (Patient not taking: Reported on 04/20/2021) 15 g 0  . norethindrone-ethinyl estradiol (LOESTRIN FE) 1-20 MG-MCG tablet Take 1 tablet by mouth daily. (Patient not taking: Reported on 10/04/2020) 28 tablet 11  . Prenatal 28-0.8 MG TABS Take 1 tablet by mouth daily.     No current facility-administered medications for this visit.    Review of Systems  Constitutional: Positive for chills, fatigue and  unexpected weight change. Negative for fever.  HENT: Negative for trouble swallowing.  Eyes: Negative for loss of vision.  Respiratory: Negative for cough, shortness of breath and wheezing.  Cardiovascular: Negative for chest pain, leg swelling, palpitations and syncope.  GI: Negative for abdominal pain, blood in stool, diarrhea, nausea and vomiting.  GU: Negative for difficulty urinating, dysuria, frequency and hematuria.  Musculoskeletal: Negative for back pain, leg pain and joint pain.  Skin: Negative for rash.  Neurological: Negative for dizziness, headaches,  light-headedness, numbness and seizures.  Psychiatric: Negative for behavioral problem, confusion, depressed mood and sleep disturbance.        Objective:  Objective   Vitals:   04/20/21 1418  BP: 116/72  Weight: 172 lb (78 kg)  Height: 5\' 6"  (1.676 m)   Body mass index is 27.76 kg/m.  Physical Exam Vitals and nursing note reviewed. Exam conducted with a chaperone present.  Constitutional:      Appearance: Normal appearance. She is well-developed.  HENT:     Head: Normocephalic and atraumatic.  Eyes:     Extraocular Movements: Extraocular movements intact.     Pupils: Pupils are equal, round, and reactive to light.  Cardiovascular:     Rate and Rhythm: Normal rate and regular rhythm.  Pulmonary:     Effort: Pulmonary effort is normal. No respiratory distress.     Breath sounds: Normal breath sounds.  Abdominal:     General: Abdomen is flat.     Palpations: Abdomen is soft.  Genitourinary:    Comments: External: Normal appearing vulva. No lesions noted.  Speculum examination: Normal appearing cervix. No blood in the vaginal vault. white discharge.  no IUD strings seen Bimanual examination: Uterus midline, non-tender, normal in size, shape and contour.  No CMT. No adnexal masses. No adnexal tenderness. Pelvis not fixed.  Breast exam: exam not performed Musculoskeletal:        General: No signs of injury.  Skin:    General: Skin is warm and dry.  Neurological:     Mental Status: She is alert and oriented to person, place, and time.  Psychiatric:        Behavior: Behavior normal.        Thought Content: Thought content normal.        Judgment: Judgment normal.     Assessment/Plan:     30 year old 1. Pelvic pain-no significant findings on today's exam.  Will recheck swab for vaginitis to rule out any times types of infection.  Discussed how endometriosis evaluate is evaluated and treated and discussed options with patient could consider if desired such as hormonal  birth control methods Orilissa and Lupron.  Discussed pain control methods which are nonprescription such as Motrin and warm showers or baths and heating pads.  Patient can consider options and will follow-up if she desires medical management. 2.  Fatigue and amenorrhea we will evaluate labs ordered iron studies ferritin B12 and vitamin D along with thyroid testing. UPT today is negative.  3. Unexpected weight changes- discussed that breast feeding burns 500 calories a day and that this could be contributing to weight changes and amnorrhea. Patient currently is maintaining a healthy weight and is not underweight.   More than 30 minutes were spent face to face with the patient in the room, reviewing the medical record, labs and images, and coordinating care for the patient. The plan of management was discussed in detail and counseling was provided.      26 MD [facility],  [facility] 04/20/2021 3:07 PM

## 2021-04-21 LAB — VITAMIN B12: Vitamin B-12: 998 pg/mL (ref 232–1245)

## 2021-04-21 LAB — TSH+FREE T4
Free T4: 1.53 ng/dL (ref 0.82–1.77)
TSH: 0.95 u[IU]/mL (ref 0.450–4.500)

## 2021-04-21 LAB — VITAMIN D 25 HYDROXY (VIT D DEFICIENCY, FRACTURES): Vit D, 25-Hydroxy: 24.9 ng/mL — ABNORMAL LOW (ref 30.0–100.0)

## 2021-04-21 LAB — FERRITIN: Ferritin: 38 ng/mL (ref 15–150)

## 2021-04-23 ENCOUNTER — Other Ambulatory Visit: Payer: Self-pay | Admitting: Obstetrics and Gynecology

## 2021-04-23 DIAGNOSIS — R875 Abnormal microbiological findings in specimens from female genital organs: Secondary | ICD-10-CM

## 2021-04-25 LAB — NUSWAB VAGINITIS PLUS (VG+)
Atopobium vaginae: HIGH Score — AB
Candida albicans, NAA: NEGATIVE
Candida glabrata, NAA: NEGATIVE
Chlamydia trachomatis, NAA: NEGATIVE
Neisseria gonorrhoeae, NAA: NEGATIVE
Trich vag by NAA: NEGATIVE

## 2021-04-30 ENCOUNTER — Telehealth: Payer: Self-pay

## 2021-04-30 NOTE — Telephone Encounter (Signed)
Pt calling; has test results from last week; hasn't heard about how to move forward c test results.  [phone]

## 2021-05-03 ENCOUNTER — Other Ambulatory Visit: Payer: Self-pay | Admitting: Obstetrics and Gynecology

## 2021-05-03 DIAGNOSIS — E559 Vitamin D deficiency, unspecified: Secondary | ICD-10-CM

## 2021-05-03 NOTE — Telephone Encounter (Signed)
Pt calling; called earlier in the week and hasn't heard back about her test results from last week and how to move forward.  [phone]

## 2021-05-03 NOTE — Progress Notes (Signed)
Discussed lab results and low vitamin D with patient. She has been taking a PNV with 800 IU of Vitamin D in int for over a year.  Will add on 1000 IU at a different time of day and then repeat Vitamin D labs in 3-4 months.  Lab order placed.   Adelene Idler MD, [facility], [facility] 05/03/2021 4:10 PM

## 2022-09-23 ENCOUNTER — Other Ambulatory Visit: Payer: Self-pay

## 2023-07-05 ENCOUNTER — Encounter: Payer: Self-pay | Admitting: Emergency Medicine

## 2023-07-05 ENCOUNTER — Ambulatory Visit
Admission: EM | Admit: 2023-07-05 | Discharge: 2023-07-05 | Disposition: A | Discharge status: Home / Self Care | Payer: Managed Care, Other (non HMO) | Attending: Urgent Care | Admitting: Urgent Care

## 2023-07-05 DIAGNOSIS — H1033 Unspecified acute conjunctivitis, bilateral: Secondary | ICD-10-CM | POA: Diagnosis not present

## 2023-07-05 MED ORDER — OLOPATADINE HCL 0.2 % OP SOLN: 1.0000 [drp] | Freq: Two times a day (BID) | OPHTHALMIC | 0 refills | Status: AC | PRN

## 2023-07-05 MED ORDER — MOXIFLOXACIN HCL 0.5 % OP SOLN: 1.0000 [drp] | Freq: Three times a day (TID) | OPHTHALMIC | 0 refills | Status: AC

## 2023-07-05 NOTE — ED Provider Notes (Signed)
Renaldo Fiddler    CSN: [id number] Arrival date & time: 07/05/23  4098      History   Chief Complaint Chief Complaint  Patient presents with   Conjunctivitis    HPI Megan Newman is a 32 y.o. female.    Conjunctivitis    This to UC with complaint of bilateral conjunctival irritation starting this morning.  She reports worsening seasonal allergies causing eye itching recently.  Endorses yellow-colored drainage this morning.  Past Medical History:  Diagnosis Date   Anxiety    Ectopic pregnancy 02/2019   Ruptured right ectopic-had lap salpingostomy   Migraine    Ovarian cyst     Patient Active Problem List   Diagnosis Date Noted   Normal labor 07/24/2020   Arrest of descent, delivered, current hospitalization 07/24/2020   Vacuum-assisted vaginal delivery 07/24/2020   Postpartum care following vaginal delivery 07/24/2020   Acute blood loss anemia 07/24/2020   Pregnancy 07/22/2020   Labor and delivery, indication for care 07/21/2020   Obesity in pregnancy 06/25/2020   Obesity (BMI 35.0-39.9 without comorbidity) 06/25/2020   Migraine 11/23/2019   Anxiety 11/23/2019   Supervision of other normal pregnancy, antepartum 11/23/2019   History of ectopic pregnancy 05/26/2019   Ruptured right tubal ectopic pregnancy causing hemoperitoneum 03/21/2019   History of abnormal cervical Pap smear 11/10/2013   Irregular menstrual bleeding 11/10/2013   Pelvic pain in female 07/28/2013    Past Surgical History:  Procedure Laterality Date   arthroscopic knee surgery (Right knee)     right knee   LAPAROSCOPY N/A 03/21/2019   Procedure: LAPAROSCOPY DIAGNOSTIC, right sapingostomy;  Surgeon: Nadara Mustard, MD;  Location: [facility] ORS;  Service: Gynecology;  Laterality: N/A;   MYRINGOTOMY Bilateral     OB History     Gravida  2   Para  1   Term  1   Preterm      AB  1   Living  1      SAB      IAB      Ectopic  1   Multiple  0   Live Births  1             Home Medications    Prior to Admission medications   Medication Sig Start Date End Date Taking? Authorizing Provider  docusate sodium (COLACE) 100 MG capsule Take 1 capsule (100 mg total) by mouth daily. Patient not taking: Reported on 10/04/2020 07/24/20   Farrel Conners, CNM  ferrous sulfate 325 (65 FE) MG EC tablet Take 1 tablet (325 mg total) by mouth every morning. Patient not taking: Reported on 10/04/2020 09/14/20   Natale Milch, MD  mupirocin cream (BACTROBAN) 2 % Apply 1 application topically 2 (two) times daily. Patient not taking: Reported on 04/20/2021 08/15/20   Mirna Mires, CNM  norethindrone-ethinyl estradiol (LOESTRIN FE) 1-20 MG-MCG tablet Take 1 tablet by mouth daily. Patient not taking: Reported on 10/04/2020 08/31/20 08/31/21  Natale Milch, MD  Prenatal 28-0.8 MG TABS Take 1 tablet by mouth daily.    [provider]    Family History Family History  Problem Relation Age of Onset   Breast cancer Mother 75   Lung cancer Maternal Grandmother    Heart disease Paternal Grandmother     Social History Social History   Tobacco Use   Smoking status: Never   Smokeless tobacco: Never  Vaping Use   Vaping Use: Never used  Substance Use Topics  Alcohol use: Not Currently    Comment: occassional   Drug use: No     Allergies   Cefixime   Review of Systems Review of Systems   Physical Exam Triage Vital Signs ED Triage Vitals  Enc Vitals Group     BP 07/05/23 0905 114/74     Pulse Rate 07/05/23 0905 65     Resp 07/05/23 0905 16     Temp 07/05/23 0905 99.3 F (37.4 C)     Temp Source 07/05/23 0905 Oral     SpO2 07/05/23 0905 96 %     Weight --      Height --      Head Circumference --      Peak Flow --      Pain Score 07/05/23 0910 1     Pain Loc --      Pain Edu? --      Excl. in GC? --    No data found.  Updated Vital Signs BP 114/74 (BP Location: Left Arm)   Pulse 65   Temp 99.3 F (37.4 C) (Oral)    Resp 16   SpO2 96%   Visual Acuity Right Eye Distance:   Left Eye Distance:   Bilateral Distance:    Right Eye Near:   Left Eye Near:    Bilateral Near:     Physical Exam Vitals reviewed.  Constitutional:      Appearance: Normal appearance.  Skin:    General: Skin is warm and dry.  Neurological:     General: No focal deficit present.     Mental Status: She is alert and oriented to person, place, and time.  Psychiatric:        Mood and Affect: Mood normal.        Behavior: Behavior normal.      UC Treatments / Results  Labs (all labs ordered are listed, but only abnormal results are displayed) Labs Reviewed - No data to display  EKG   Radiology No results found.  Procedures Procedures (including critical care time)  Medications Ordered in UC Medications - No data to display  Initial Impression / Assessment and Plan / UC Course  I have reviewed the triage vital signs and the nursing notes.  Pertinent labs & imaging results that were available during my care of the patient were reviewed by me and considered in my medical decision making (see chart for details).   Megan Newman is a 32 y.o. female presenting with bilateral conjunctivitis. Patient is afebrile without recent antipyretics, satting well on room air. Overall is well appearing though non-toxic, well hydrated, without respiratory distress.   Conjunctival injection is present bilaterally.  No exudate currently is present.  Reviewed relevant chart history.   Will treat both for allergic conjunctivitis with olopatadine as well as bacterial given her assertion of yellow exudate present.  Giving moxifloxacin.  Counseled patient on potential for adverse effects with medications prescribed/recommended today, ER and return-to-clinic precautions discussed, patient verbalized understanding and agreement with care plan.  Final Clinical Impressions(s) / UC Diagnoses   Final diagnoses:  None    Discharge Instructions   None    ED Prescriptions   None    PDMP not reviewed this encounter.   Charma Igo, [place] 07/05/23 [phone]

## 2023-07-05 NOTE — Discharge Instructions (Signed)
Follow-up here or with an eye doctor if your symptoms do not resolve with treatment.

## 2023-07-05 NOTE — ED Triage Notes (Signed)
Bilateral conjunctival irritation beginning on waking this morning. Denies any foreign body involvement, visual changes. Reports that her seasonal allergies have been causing more eye itching recently, but has not been using any OTC medicated eye drops to treat. Reports irritation, yellow colored drainage bilaterally, mild itching, scratchy-like sensation in left eye only.

## 2023-07-09 ENCOUNTER — Other Ambulatory Visit: Payer: Self-pay

## 2023-07-09 DIAGNOSIS — K5900 Constipation, unspecified: Secondary | ICD-10-CM | POA: Diagnosis not present

## 2023-07-09 DIAGNOSIS — R109 Unspecified abdominal pain: Secondary | ICD-10-CM | POA: Diagnosis present

## 2023-07-09 NOTE — ED Triage Notes (Signed)
Pt presents to ER with c/o constipation and lower back and abd pain that has been worsening over last few days.  Pt states her last normal BM was over a week ago.  Pt states she has had small amt of stool yesterday but nothing normal.  Pt denies n/v.  Pt has hx of chronic constipation.  Pt is otherwise A&O x4 and in NAD.

## 2023-07-10 ENCOUNTER — Emergency Department: Payer: Managed Care, Other (non HMO)

## 2023-07-10 ENCOUNTER — Emergency Department
Admission: EM | Admit: 2023-07-10 | Discharge: 2023-07-10 | Disposition: A | Discharge status: Home / Self Care | Payer: Managed Care, Other (non HMO) | Attending: Emergency Medicine | Admitting: Emergency Medicine

## 2023-07-10 DIAGNOSIS — K5900 Constipation, unspecified: Secondary | ICD-10-CM

## 2023-07-10 LAB — URINALYSIS, ROUTINE W REFLEX MICROSCOPIC
Bilirubin Urine: NEGATIVE
Glucose, UA: NEGATIVE mg/dL
Hgb urine dipstick: NEGATIVE
Ketones, ur: NEGATIVE mg/dL
Leukocytes,Ua: NEGATIVE
Nitrite: NEGATIVE
Protein, ur: NEGATIVE mg/dL
Specific Gravity, Urine: 1.012 (ref 1.005–1.030)
pH: 5 (ref 5.0–8.0)

## 2023-07-10 LAB — PREGNANCY, URINE: Preg Test, Ur: NEGATIVE

## 2023-07-10 NOTE — ED Provider Notes (Signed)
[facility] Provider Note    Event Date/Time   First MD Initiated Contact with Patient 07/10/23 0413     (approximate)   History   No chief complaint on file.   HPI Megan Newman is a 32 y.o. female who presents for evaluation of constipation with some gradually worsening pain and tightness in her abdomen as well as in her lower back.  She has dealt with constipation since she was a child but she does not take anything every day.  She tried some of the usual methods for encouraging a bowel movement such as exercise, coffee, and a laxative, but nothing was working.  However, while she was in the emergency department and before she was evaluated by me, she had a large volume bowel movement without any medication intervention and feels much better.     Physical Exam   Triage Vital Signs: ED Triage Vitals  Encounter Vitals Group     BP 07/09/23 2351 122/76     Systolic BP Percentile --      Diastolic BP Percentile --      Pulse Rate 07/09/23 2351 90     Resp 07/09/23 2351 18     Temp 07/09/23 2351 98.5 F (36.9 C)     Temp Source 07/09/23 2351 Oral     SpO2 07/09/23 2351 100 %     Weight 07/09/23 2354 90.3 kg (199 lb)     Height 07/09/23 2354 1.676 m (5\' 6" )     Head Circumference --      Peak Flow --      Pain Score 07/09/23 2353 3     Pain Loc --      Pain Education --      Exclude from Growth Chart --     Most recent vital signs: Vitals:   07/10/23 0413 07/10/23 0520  BP: 117/60 101/71  Pulse: 81 76  Resp: 17 16  Temp: 98.9 F (37.2 C) 98.3 F (36.8 C)  SpO2: 100% 98%    General: Awake, no distress.  CV:  Good peripheral perfusion.  Resp:  Normal effort. Speaking easily and comfortably, no accessory muscle usage nor intercostal retractions.   Abd:  No distention.  No tenderness to palpation.   ED Results / Procedures / Treatments   Labs (all labs ordered are listed, but only abnormal results are displayed) Labs Reviewed   URINALYSIS, ROUTINE W REFLEX MICROSCOPIC - Abnormal; Notable for the following components:      Result Value   Color, Urine YELLOW (*)    APPearance CLEAR (*)    All other components within normal limits  PREGNANCY, URINE       RADIOLOGY I viewed and interpreted the patient's 1 view abdominal x-ray.  I see no evidence of obstruction.  There is a noticeable stool burden which the radiologist confirmed.   PROCEDURES:  Critical Care performed: No  Procedures    IMPRESSION / MDM / ASSESSMENT AND PLAN / ED COURSE  I reviewed the triage vital signs and the nursing notes.                              Differential diagnosis includes, but is not limited to, nonspecific constipation, medication or drug side effect, electrolyte abnormality.  Patient's presentation is most consistent with exacerbation of chronic illness.  Labs/studies ordered: Urinalysis, urine pregnancy test, 1 view abdomen x-ray  Interventions/Medications given:  Medications -  No data to display  (Note:  hospital course my include additional interventions and/or labs/studies not listed above.)   X-ray consistent with moderate stool burden, urinalysis unremarkable and urine pregnancy test is negative.  Prior to me seeing the patient she had a large bowel movement and is comfortable with the plan for discharge home.  I gave her my usual constipation recommendations and over-the-counter remedies and she will follow-up as needed.         FINAL CLINICAL IMPRESSION(S) / ED DIAGNOSES   Final diagnoses:  Constipation, unspecified constipation type     Rx / DC Orders   ED Discharge Orders     None        Note:  This document was prepared using Dragon voice recognition software and may include unintentional dictation errors.   Loleta Rose, MD 07/10/23 [id number]

## 2023-07-10 NOTE — Discharge Instructions (Signed)
You were seen in the emergency department today for constipation.  We recommend that you use one or more of the following over-the-counter medications in the order described:   1)  Miralax (powder):  This medication works by drawing additional fluid into your intestines and helps to flush out your stool.  Mix the powder with water or juice according to label instructions.  Be sure to use the recommended amount of water or juice when you mix up the powder.  Plenty of fluids will help to prevent constipation. 2)  Colace (or Dulcolax) 100 mg:  This is a stool softener, and you may take it once or twice a day as needed. 3)  Senna tablets:  This is a bowel stimulant that will help "push" out your stool. It is the next step to add after you have tried a stool softener.  You may also want to consider using glycerin suppositories, which you insert into your rectum.  You hold it in place and is dissolves and softens your stool and stimulates your bowels.  You could also consider using an enema, which is also available over the counter.  Remember that narcotic pain medications are constipating, so avoid them or minimize their use.  Drink plenty of fluids.  Please return to the Emergency Department immediately if you develop new or worsening symptoms that concern you, such as (but not limited to) fever > 101 degrees, severe abdominal pain, or persistent vomiting.  

## 2024-03-26 ENCOUNTER — Other Ambulatory Visit: Payer: Self-pay | Admitting: Physician Assistant

## 2024-03-26 DIAGNOSIS — R947 Abnormal results of other endocrine function studies: Secondary | ICD-10-CM

## 2024-04-21 ENCOUNTER — Other Ambulatory Visit: Payer: Self-pay | Admitting: Physician Assistant

## 2024-04-21 DIAGNOSIS — R947 Abnormal results of other endocrine function studies: Secondary | ICD-10-CM

## 2024-04-27 ENCOUNTER — Ambulatory Visit
Admission: RE | Admit: 2024-04-27 | Discharge: 2024-04-27 | Disposition: A | Discharge status: Home / Self Care | Source: Ambulatory Visit | Attending: Physician Assistant | Admitting: Physician Assistant

## 2024-04-27 ENCOUNTER — Encounter: Payer: Self-pay | Admitting: Radiology

## 2024-04-27 DIAGNOSIS — R947 Abnormal results of other endocrine function studies: Secondary | ICD-10-CM

## 2024-04-27 MED ORDER — IOPAMIDOL (ISOVUE-300) INJECTION 61%
100.0000 mL | Freq: Once | INTRAVENOUS | Status: AC | PRN
  Administered 2024-04-27: 100 mL via INTRAVENOUS

## 2024-07-12 ENCOUNTER — Encounter: Payer: Self-pay | Admitting: Emergency Medicine

## 2024-07-12 ENCOUNTER — Ambulatory Visit
Admission: EM | Admit: 2024-07-12 | Discharge: 2024-07-12 | Disposition: A | Discharge status: Home / Self Care | Attending: Emergency Medicine | Admitting: Emergency Medicine

## 2024-07-12 DIAGNOSIS — H1013 Acute atopic conjunctivitis, bilateral: Secondary | ICD-10-CM | POA: Diagnosis not present

## 2024-07-12 MED ORDER — PREDNISONE 10 MG (21) PO TBPK: ORAL_TABLET | Freq: Every day | ORAL | 0 refills | Status: AC

## 2024-07-12 NOTE — Discharge Instructions (Signed)
 Today you being treated for allergic conjunctivitis, low suspicion that this is a bacterial infection as there is no puslike drainage  Given oral prednisone  every morning as directed to stop any reaction process which should help to clear symptoms  May continue use of allergy eyedrops and medication in addition to this to help with itching  May use cool compress for comfort and to remove discharge if present. Pat the eye, do not wipe.   Wear glasses until symptoms have resolved.   Do not rub eyes, this may cause more irritation.  May use benadryl  as needed to help if itching present.  Please avoid use of eye makeup until symptoms clear.  If symptoms persist after use of medication, please follow up at Urgent Care or with ophthalmologist (eye doctor)

## 2024-07-12 NOTE — ED Triage Notes (Signed)
 Patient reports red itchy eyes x 3 weeks. Reports clear drainage, Denies pain.

## 2024-07-12 NOTE — ED Provider Notes (Signed)
 Megan Newman    CSN: [id number] Arrival date & time: 07/12/24  1110      History   Chief Complaint Chief Complaint  Patient presents with   Ear Drainage    HPI Megan Newman is a 33 y.o. female.   Presents for evaluation of bilateral eye redness, pruritus and increased tearing present for 3 weeks.  Has attempted use of allergy eyedrops and antihistamines which have provided minimal relief.  Denies purulent drainage, changes in vision, light sensitivity, injury or trauma.  Denies use of contacts but does wear glasses.  Past Medical History:  Diagnosis Date   Anxiety    Ectopic pregnancy 02/2019   Ruptured right ectopic-had lap salpingostomy   Migraine    Ovarian cyst     Patient Active Problem List   Diagnosis Date Noted   Normal labor 07/24/2020   Arrest of descent, delivered, current hospitalization 07/24/2020   Vacuum-assisted vaginal delivery 07/24/2020   Postpartum care following vaginal delivery 07/24/2020   Acute blood loss anemia 07/24/2020   Pregnancy 07/22/2020   Labor and delivery, indication for care 07/21/2020   Obesity in pregnancy 06/25/2020   Obesity (BMI 35.0-39.9 without comorbidity) 06/25/2020   Migraine 11/23/2019   Anxiety 11/23/2019   Supervision of other normal pregnancy, antepartum 11/23/2019   History of ectopic pregnancy 05/26/2019   Ruptured right tubal ectopic pregnancy causing hemoperitoneum 03/21/2019   History of abnormal cervical Pap smear 11/10/2013   Irregular menstrual bleeding 11/10/2013   Pelvic pain in female 07/28/2013    Past Surgical History:  Procedure Laterality Date   arthroscopic knee surgery (Right knee)     right knee   LAPAROSCOPY N/A 03/21/2019   Procedure: LAPAROSCOPY DIAGNOSTIC, right sapingostomy;  Surgeon: Arloa Lamar SQUIBB, MD;  Location: [facility] ORS;  Service: Gynecology;  Laterality: N/A;   MYRINGOTOMY Bilateral     OB History     Gravida  2   Para  1   Term  1   Preterm      AB  1    Living  1      SAB      IAB      Ectopic  1   Multiple  0   Live Births  1            Home Medications    Prior to Admission medications   Medication Sig Start Date End Date Taking? Authorizing Provider  predniSONE  (STERAPRED UNI-PAK 21 TAB) 10 MG (21) TBPK tablet Take by mouth daily. Take 6 tabs by mouth daily  for 1 days, then 5 tabs for 1 days, then 4 tabs for 1 days, then 3 tabs for 1 days, 2 tabs for 1 days, then 1 tab by mouth daily for 1 days 07/12/24  Yes Christiona Siddique R, NP  docusate sodium  (COLACE) 100 MG capsule Take 1 capsule (100 mg total) by mouth daily. Patient not taking: Reported on 10/04/2020 07/24/20   Rilla Barnacle, CNM  ferrous sulfate  325 (65 FE) MG EC tablet Take 1 tablet (325 mg total) by mouth every morning. Patient not taking: Reported on 10/04/2020 09/14/20   Victor Claudell SAUNDERS, MD  mupirocin  cream (BACTROBAN ) 2 % Apply 1 application topically 2 (two) times daily. Patient not taking: Reported on 04/20/2021 08/15/20   Carlin Rollene HERO, CNM  norethindrone-ethinyl estradiol (LOESTRIN FE) 1-20 MG-MCG tablet Take 1 tablet by mouth daily. Patient not taking: Reported on 10/04/2020 08/31/20 08/31/21  Victor Claudell SAUNDERS, MD  Olopatadine  HCl 0.2 %  SOLN Apply 1 drop to eye 2 (two) times daily as needed. 07/05/23   Immordino, Garnette, FNP  Prenatal 28-0.8 MG TABS Take 1 tablet by mouth daily.    [provider]    Family History Family History  Problem Relation Age of Onset   Breast cancer Mother 61   Lung cancer Maternal Grandmother    Heart disease Paternal Grandmother     Social History Social History   Tobacco Use   Smoking status: Never   Smokeless tobacco: Never  Vaping Use   Vaping status: Never Used  Substance Use Topics   Alcohol use: Not Currently    Comment: occassional   Drug use: No     Allergies   Cefixime   Review of Systems Review of Systems   Physical Exam Triage Vital Signs ED Triage Vitals  Encounter  Vitals Group     BP 07/12/24 1132 108/76     Girls Systolic BP Percentile --      Girls Diastolic BP Percentile --      Boys Systolic BP Percentile --      Boys Diastolic BP Percentile --      Pulse Rate 07/12/24 1132 71     Resp 07/12/24 1132 18     Temp 07/12/24 1132 97.8 F (36.6 C)     Temp Source 07/12/24 1132 Oral     SpO2 07/12/24 1132 98 %     Weight --      Height --      Head Circumference --      Peak Flow --      Pain Score 07/12/24 1134 0     Pain Loc --      Pain Education --      Exclude from Growth Chart --    No data found.  Updated Vital Signs BP 108/76 (BP Location: Right Arm)   Pulse 71   Temp 97.8 F (36.6 C) (Oral)   Resp 18   LMP 06/28/2024 (Exact Date)   SpO2 98%   Breastfeeding No   Visual Acuity Right Eye Distance: 20/25 Left Eye Distance: 20/20 Bilateral Distance: 20/16  Right Eye Near:   Left Eye Near:    Bilateral Near:     Physical Exam Constitutional:      Appearance: Normal appearance.  Eyes:     Comments: Erythema present to the bilateral conjunctiva with mild periorbital swelling and mild allergic shiners, scant flaky skin present to the left eyelid, no drainage noted, vision grossly intact, extraocular movements intact  Neurological:     Mental Status: She is alert and oriented to person, place, and time.      UC Treatments / Results  Labs (all labs ordered are listed, but only abnormal results are displayed) Labs Reviewed - No data to display  EKG   Radiology No results found.  Procedures Procedures (including critical care time)  Medications Ordered in UC Medications - No data to display  Initial Impression / Assessment and Plan / UC Course  I have reviewed the triage vital signs and the nursing notes.  Pertinent labs & imaging results that were available during my care of the patient were reviewed by me and considered in my medical decision making (see chart for details).  \Allergic conjunctivitis of both  eyes  Presentation is consistent with inflammatory process, no signs of infection, denies drainage, seeing improvement with over-the-counter medications but symptoms are persisting therefore initiating oral prednisone , advised ophthalmology follow-up if no improvement at  this time deferring use of antibiotics Final Clinical Impressions(s) / UC Diagnoses   Final diagnoses:  Allergic conjunctivitis of both eyes     Discharge Instructions      Today you being treated for allergic conjunctivitis, low suspicion that this is a bacterial infection as there is no puslike drainage  Given oral prednisone  every morning as directed to stop any reaction process which should help to clear symptoms  May continue use of allergy eyedrops and medication in addition to this to help with itching  May use cool compress for comfort and to remove discharge if present. Pat the eye, do not wipe.   Wear glasses until symptoms have resolved.   Do not rub eyes, this may cause more irritation.  May use benadryl  as needed to help if itching present.  Please avoid use of eye makeup until symptoms clear.  If symptoms persist after use of medication, please follow up at Urgent Care or with ophthalmologist (eye doctor)    ED Prescriptions     Medication Sig Dispense Auth. Provider   predniSONE  (STERAPRED UNI-PAK 21 TAB) 10 MG (21) TBPK tablet Take by mouth daily. Take 6 tabs by mouth daily  for 1 days, then 5 tabs for 1 days, then 4 tabs for 1 days, then 3 tabs for 1 days, 2 tabs for 1 days, then 1 tab by mouth daily for 1 days 21 tablet Rozella Servello, Shelba SAUNDERS, NP      PDMP not reviewed this encounter.   Teresa Shelba SAUNDERS, NP 07/12/24 1158

## 2024-09-24 ENCOUNTER — Ambulatory Visit: Admitting: "Endocrinology

## 2024-11-11 ENCOUNTER — Encounter: Payer: Self-pay | Admitting: "Endocrinology

## 2024-11-11 ENCOUNTER — Ambulatory Visit (INDEPENDENT_AMBULATORY_CARE_PROVIDER_SITE_OTHER): Admitting: "Endocrinology

## 2024-11-11 VITALS — BP 100/80 | HR 82 | Ht 66.0 in | Wt 212.0 lb

## 2024-11-11 DIAGNOSIS — R7989 Other specified abnormal findings of blood chemistry: Secondary | ICD-10-CM

## 2024-11-11 NOTE — Progress Notes (Signed)
 Outpatient Endocrinology Note Obadiah Birmingham, MD    Megan Newman 1991/09/29 [id number]  Referring Provider: Lanell Earleen HERO, PA Primary Care Provider: Hampton, Lizet, PA Reason for consultation: Subjective   Assessment & Plan  Diagnoses and all orders for this visit:  Low serum cortisol level -     Cortisol -     ACTH   Patient is referred for low cortisol found at 6 at 8:56 AM on 03/11/2024, done at [facility] Patient has no symptoms of hypercortisolism clinically Patient is usually up by 6:30 AM Ordered repeat early morning lab  Return in about 20 days (around 12/01/2024) for telemed appt .   I have reviewed current medications, nurse's notes, allergies, vital signs, past medical and surgical history, family medical history, and social history for this encounter. Counseled patient on symptoms, examination findings, lab findings, imaging results, treatment decisions and monitoring and prognosis. The patient understood the recommendations and agrees with the treatment plan. All questions regarding treatment plan were fully answered.  Obadiah Birmingham, MD  11/11/24   History of Present Illness HPI  Megan Newman is a 33 y.o. year old female who presents for evaluation of low cortisol.  Denies unintentional weight loss, abdominal pain, nausea/vomiting Not taking steroids, last took prednisone  in 06/2024 Wakes up at 6:30 in am Denies dizziness/lightheaded No history of low BP   Physical Exam  BP 100/80   Pulse 82   Ht 5' 6 (1.676 m)   Wt 212 lb (96.2 kg)   SpO2 99%   BMI 34.22 kg/m    Constitutional: well developed, well nourished Head: normocephalic, atraumatic Eyes: sclera anicteric, no redness Neck: supple Lungs: normal respiratory effort Neurology: alert and oriented Skin: dry, no appreciable rashes Musculoskeletal: no appreciable defects Psychiatric: normal mood and affect   Current Medications Patient's Medications  New Prescriptions    No medications on file  Previous Medications   DOCUSATE SODIUM  (COLACE) 100 MG CAPSULE    Take 1 capsule (100 mg total) by mouth daily.   FERROUS SULFATE  325 (65 FE) MG EC TABLET    Take 1 tablet (325 mg total) by mouth every morning.   MUPIROCIN  CREAM (BACTROBAN ) 2 %    Apply 1 application topically 2 (two) times daily.   NORETHINDRONE-ETHINYL ESTRADIOL (LOESTRIN FE) 1-20 MG-MCG TABLET    Take 1 tablet by mouth daily.   OLOPATADINE  HCL 0.2 % SOLN    Apply 1 drop to eye 2 (two) times daily as needed.   PREDNISONE  (STERAPRED UNI-PAK 21 TAB) 10 MG (21) TBPK TABLET    Take by mouth daily. Take 6 tabs by mouth daily  for 1 days, then 5 tabs for 1 days, then 4 tabs for 1 days, then 3 tabs for 1 days, 2 tabs for 1 days, then 1 tab by mouth daily for 1 days   PRENATAL 28-0.8 MG TABS    Take 1 tablet by mouth daily.  Modified Medications   No medications on file  Discontinued Medications   No medications on file    Allergies Allergies  Allergen Reactions   Cefixime Rash    Past Medical History Past Medical History:  Diagnosis Date   Anxiety    Ectopic pregnancy 02/2019   Ruptured right ectopic-had lap salpingostomy   Migraine    Ovarian cyst     Past Surgical History Past Surgical History:  Procedure Laterality Date   arthroscopic knee surgery (Right knee)     right knee   LAPAROSCOPY  N/A 03/21/2019   Procedure: LAPAROSCOPY DIAGNOSTIC, right sapingostomy;  Surgeon: Arloa Lamar SQUIBB, MD;  Location: [facility] ORS;  Service: Gynecology;  Laterality: N/A;   MYRINGOTOMY Bilateral     Family History family history includes Breast cancer (age of onset: 43) in her mother; Heart disease in her paternal grandmother; Lung cancer in her maternal grandmother.  Social History Social History   Socioeconomic History   Marital status: Married    Spouse name: Rome Pole   Number of children: Not on file   Years of education: 16   Highest education level: Not on file  Occupational History    Occupation: Researcher  Tobacco Use   Smoking status: Never   Smokeless tobacco: Never  Vaping Use   Vaping status: Never Used  Substance and Sexual Activity   Alcohol use: Not Currently    Comment: occassional   Drug use: No   Sexual activity: Not Currently    Birth control/protection: None  Other Topics Concern   Not on file  Social History Narrative   Not on file   Social Drivers of Health   Financial Resource Strain: Not on file  Food Insecurity: Not on file  Transportation Needs: Not on file  Physical Activity: Not on file  Stress: Not on file  Social Connections: Not on file  Intimate Partner Violence: Not on file    No results found for: CHOL No results found for: HDL No results found for: LDLCALC No results found for: TRIG No results found for: [facility] Lab Results  Component Value Date   CREATININE 0.61 07/26/2020   No results found for: GFR    Component Value Date/Time   NA 140 07/26/2020 0405   K 3.5 07/26/2020 0405   CL 108 07/26/2020 0405   CO2 21 (L) 07/26/2020 0405   GLUCOSE 84 07/26/2020 0405   BUN 14 07/26/2020 0405   CREATININE 0.61 07/26/2020 0405   CALCIUM  8.3 (L) 07/26/2020 0405   PROT 6.2 (L) 07/26/2020 0405   ALBUMIN 2.9 (L) 07/26/2020 0405   AST 83 (H) 07/26/2020 0405   ALT 88 (H) 07/26/2020 0405   ALKPHOS 107 07/26/2020 0405   BILITOT 0.6 07/26/2020 0405   GFRNONAA >60 07/26/2020 0405   GFRAA >60 07/26/2020 0405      Latest Ref Rng & Units 07/26/2020    4:05 AM 10/08/2015    9:36 AM  BMP  Glucose 70 - 99 mg/dL 84  899   BUN 6 - 20 mg/dL 14  8   Creatinine 9.55 - 1.00 mg/dL 9.38  9.30   Sodium 864 - 145 mmol/L 140  136   Potassium 3.5 - 5.1 mmol/L 3.5  3.9   Chloride 98 - 111 mmol/L 108  107   CO2 22 - 32 mmol/L 21  21   Calcium  8.9 - 10.3 mg/dL 8.3  9.0        Component Value Date/Time   WBC 7.9 07/26/2020 0405   RBC 3.31 (L) 07/26/2020 0405   HGB 9.6 (L) 07/26/2020 0405   HGB 11.9 04/27/2020 1011   HCT  29.5 (L) 07/26/2020 0405   HCT 36.0 04/27/2020 1011   PLT 293 07/26/2020 0405   PLT 256 04/27/2020 1011   MCV 89.1 07/26/2020 0405   MCV 92 04/27/2020 1011   MCH 29.0 07/26/2020 0405   MCHC 32.5 07/26/2020 0405   RDW 14.0 07/26/2020 0405   RDW 12.7 04/27/2020 1011   LYMPHSABS 1.7 04/27/2020 1011   MONOABS 0.3 10/08/2015 0936  EOSABS 0.3 04/27/2020 1011   BASOSABS 0.0 04/27/2020 1011   Lab Results  Component Value Date   TSH 0.950 04/20/2021   FREET4 1.53 04/20/2021         Parts of this note may have been dictated using voice recognition software. There may be variances in spelling and vocabulary which are unintentional. Not all errors are proofread. Please notify the dino if any discrepancies are noted or if the meaning of any statement is not clear.

## 2024-12-01 ENCOUNTER — Telehealth: Admitting: "Endocrinology

## 2024-12-01 ENCOUNTER — Encounter: Payer: Self-pay | Admitting: "Endocrinology

## 2024-12-01 VITALS — Ht 66.0 in | Wt 212.0 lb

## 2024-12-01 DIAGNOSIS — R5383 Other fatigue: Secondary | ICD-10-CM

## 2024-12-01 NOTE — Progress Notes (Addendum)
 The patient reports they are currently: Megan Newman. I spent 4-5 minutes on the video with the patient on the date of service. I spent an additional 4 minutes on pre- and post-visit activities on the date of service.   The patient was physically located in [place]  or a state in which I am permitted to provide care. The patient and/or parent/guardian understood that s/he may incur co-pays and cost sharing, and agreed to the telemedicine visit. The visit was reasonable and appropriate under the circumstances given the patient's presentation at the time.  The patient and/or parent/guardian has been advised of the potential risks and limitations of this mode of treatment (including, but not limited to, the absence of in-person examination) and has agreed to be treated using telemedicine. The patient's/patient's family's questions regarding telemedicine have been answered.   The patient and/or parent/guardian has also been advised to contact their provider's office for worsening conditions, and seek emergency medical treatment and/or call 911 if the patient deems either necessary.     Outpatient Endocrinology Note Obadiah Birmingham, MD    Day Greb [birth year] [id number]  Referring Provider: Hampton, Lizet, PA Primary Care Provider: Hampton, Lizet, PA Reason for consultation: Subjective   Assessment & Plan  Diagnoses and all orders for this visit:  Fatigue, unspecified type   Patient was referred for low cortisol found at 6 at 8:56 AM on 03/11/2024, done at [facility] Patient has no symptoms of hypercortisolism clinically 11/24/2024 8 AM cortisol normal at 15.4, with normal ACTH at 43.8 which rules out adrenal insufficiency 02/2024 TSH and free T4 were also WNL which rules out thyroid contributing to any fatigue Recommend sleep study to rule out sleep apnea; patient has some symptoms of it along with chronic fatigue, patient would like to discuss that with her PCP and get it ordered with  the PCP as needed  No follow-ups on file.   I have reviewed current medications, nurse's notes, allergies, vital signs, past medical and surgical history, family medical history, and social history for this encounter. Counseled patient on symptoms, examination findings, lab findings, imaging results, treatment decisions and monitoring and prognosis. The patient understood the recommendations and agrees with the treatment plan. All questions regarding treatment plan were fully answered.  Obadiah Birmingham, MD  12/01/24   History of Present Illness HPI  Megan Newman is a 33 y.o. year old female who presents for evaluation of low cortisol.  Feels generally tired, headaches since in highs school + weight gain  Denies unintentional weight loss, abdominal pain, nausea/vomiting, dizziness/lightheaded Rarely snores at night, wake up feeling tired, doesn't doze   Not taking steroids, last took prednisone  in 06/2024 Wakes up at 6:30-7:30am in am No history of low BP     Physical Exam  Ht 5' 6 (1.676 m)   Wt 212 lb (96.2 kg)   BMI 34.22 kg/m    Constitutional: well developed, well nourished Head: normocephalic, atraumatic Eyes: sclera anicteric, no redness Neck: supple Lungs: normal respiratory effort Neurology: alert and oriented Skin: dry, no appreciable rashes Musculoskeletal: no appreciable defects Psychiatric: normal mood and affect   Current Medications Patient's Medications  New Prescriptions   No medications on file  Previous Medications   DOCUSATE SODIUM  (COLACE) 100 MG CAPSULE    Take 1 capsule (100 mg total) by mouth daily.   FERROUS SULFATE  325 (65 FE) MG EC TABLET    Take 1 tablet (325 mg total) by mouth every morning.   MUPIROCIN  CREAM (BACTROBAN ) 2 %  Apply 1 application topically 2 (two) times daily.   NORETHINDRONE-ETHINYL ESTRADIOL (LOESTRIN FE) 1-20 MG-MCG TABLET    Take 1 tablet by mouth daily.   OLOPATADINE  HCL 0.2 % SOLN    Apply 1 drop to eye 2  (two) times daily as needed.   PREDNISONE  (STERAPRED UNI-PAK 21 TAB) 10 MG (21) TBPK TABLET    Take by mouth daily. Take 6 tabs by mouth daily  for 1 days, then 5 tabs for 1 days, then 4 tabs for 1 days, then 3 tabs for 1 days, 2 tabs for 1 days, then 1 tab by mouth daily for 1 days   PRENATAL 28-0.8 MG TABS    Take 1 tablet by mouth daily.  Modified Medications   No medications on file  Discontinued Medications   No medications on file    Allergies Allergies  Allergen Reactions   Cefixime Rash    Past Medical History Past Medical History:  Diagnosis Date   Anxiety    Ectopic pregnancy 02/2019   Ruptured right ectopic-had lap salpingostomy   Migraine    Ovarian cyst     Past Surgical History Past Surgical History:  Procedure Laterality Date   arthroscopic knee surgery (Right knee)     right knee   LAPAROSCOPY N/A 03/21/2019   Procedure: LAPAROSCOPY DIAGNOSTIC, right sapingostomy;  Surgeon: Arloa Lamar SQUIBB, MD;  Location: [facility] ORS;  Service: Gynecology;  Laterality: N/A;   MYRINGOTOMY Bilateral     Family History family history includes Breast cancer (age of onset: 21) in her mother; Heart disease in her paternal grandmother; Lung cancer in her maternal grandmother.  Social History Social History   Socioeconomic History   Marital status: Married    Spouse name: Rome Pole   Number of children: Not on file   Years of education: 16   Highest education level: Not on file  Occupational History   Occupation: Researcher  Tobacco Use   Smoking status: Never   Smokeless tobacco: Never  Vaping Use   Vaping status: Never Used  Substance and Sexual Activity   Alcohol use: Not Currently    Comment: occassional   Drug use: No   Sexual activity: Not Currently    Birth control/protection: None  Other Topics Concern   Not on file  Social History Narrative   Not on file   Social Drivers of Health   Financial Resource Strain: Not on file  Food Insecurity: Not on  file  Transportation Needs: Not on file  Physical Activity: Not on file  Stress: Not on file  Social Connections: Not on file  Intimate Partner Violence: Not on file    No results found for: CHOL No results found for: HDL No results found for: LDLCALC No results found for: TRIG No results found for: [facility] Lab Results  Component Value Date   CREATININE 0.61 07/26/2020   No results found for: GFR    Component Value Date/Time   NA 140 07/26/2020 0405   K 3.5 07/26/2020 0405   CL 108 07/26/2020 0405   CO2 21 (L) 07/26/2020 0405   GLUCOSE 84 07/26/2020 0405   BUN 14 07/26/2020 0405   CREATININE 0.61 07/26/2020 0405   CALCIUM  8.3 (L) 07/26/2020 0405   PROT 6.2 (L) 07/26/2020 0405   ALBUMIN 2.9 (L) 07/26/2020 0405   AST 83 (H) 07/26/2020 0405   ALT 88 (H) 07/26/2020 0405   ALKPHOS 107 07/26/2020 0405   BILITOT 0.6 07/26/2020 0405   GFRNONAA >60 07/26/2020  0405   GFRAA >60 07/26/2020 0405      Latest Ref Rng & Units 07/26/2020    4:05 AM 10/08/2015    9:36 AM  BMP  Glucose 70 - 99 mg/dL 84  899   BUN 6 - 20 mg/dL 14  8   Creatinine 9.55 - 1.00 mg/dL 9.38  9.30   Sodium 864 - 145 mmol/L 140  136   Potassium 3.5 - 5.1 mmol/L 3.5  3.9   Chloride 98 - 111 mmol/L 108  107   CO2 22 - 32 mmol/L 21  21   Calcium  8.9 - 10.3 mg/dL 8.3  9.0        Component Value Date/Time   WBC 7.9 07/26/2020 0405   RBC 3.31 (L) 07/26/2020 0405   HGB 9.6 (L) 07/26/2020 0405   HGB 11.9 04/27/2020 1011   HCT 29.5 (L) 07/26/2020 0405   HCT 36.0 04/27/2020 1011   PLT 293 07/26/2020 0405   PLT 256 04/27/2020 1011   MCV 89.1 07/26/2020 0405   MCV 92 04/27/2020 1011   MCH 29.0 07/26/2020 0405   MCHC 32.5 07/26/2020 0405   RDW 14.0 07/26/2020 0405   RDW 12.7 04/27/2020 1011   LYMPHSABS 1.7 04/27/2020 1011   MONOABS 0.3 10/08/2015 0936   EOSABS 0.3 04/27/2020 1011   BASOSABS 0.0 04/27/2020 1011   Lab Results  Component Value Date   TSH 0.950 04/20/2021   FREET4 1.53  04/20/2021         Parts of this note may have been dictated using voice recognition software. There may be variances in spelling and vocabulary which are unintentional. Not all errors are proofread. Please notify the dino if any discrepancies are noted or if the meaning of any statement is not clear.
